# Patient Record
Sex: Female | Born: 1950 | Race: White | Hispanic: No | Marital: Married | Smoking: Former smoker
Health system: Southern US, Community
[De-identification: ages and names within clinical notes are randomized; demographics above are authoritative.]

## PROBLEM LIST (undated history)

## (undated) DIAGNOSIS — M19049 Primary osteoarthritis, unspecified hand: Secondary | ICD-10-CM

## (undated) DIAGNOSIS — E039 Hypothyroidism, unspecified: Secondary | ICD-10-CM

## (undated) DIAGNOSIS — K219 Gastro-esophageal reflux disease without esophagitis: Secondary | ICD-10-CM

## (undated) DIAGNOSIS — N951 Menopausal and female climacteric states: Secondary | ICD-10-CM

## (undated) DIAGNOSIS — Z8679 Personal history of other diseases of the circulatory system: Secondary | ICD-10-CM

## (undated) DIAGNOSIS — M8580 Other specified disorders of bone density and structure, unspecified site: Secondary | ICD-10-CM

## (undated) DIAGNOSIS — I10 Essential (primary) hypertension: Secondary | ICD-10-CM

## (undated) DIAGNOSIS — M545 Low back pain, unspecified: Secondary | ICD-10-CM

## (undated) DIAGNOSIS — N6019 Diffuse cystic mastopathy of unspecified breast: Secondary | ICD-10-CM

## (undated) DIAGNOSIS — E079 Disorder of thyroid, unspecified: Secondary | ICD-10-CM

## (undated) DIAGNOSIS — M810 Age-related osteoporosis without current pathological fracture: Secondary | ICD-10-CM

## (undated) HISTORY — DX: Gastro-esophageal reflux disease without esophagitis: K21.9

## (undated) HISTORY — DX: Personal history of other diseases of the circulatory system: Z86.79

## (undated) HISTORY — DX: Low back pain, unspecified: M54.50

## (undated) HISTORY — DX: Disorder of thyroid, unspecified: E07.9

## (undated) HISTORY — DX: Age-related osteoporosis without current pathological fracture: M81.0

## (undated) HISTORY — PX: DILATION AND CURETTAGE OF UTERUS: SHX78

## (undated) HISTORY — DX: Menopausal and female climacteric states: N95.1

## (undated) HISTORY — DX: Primary osteoarthritis, unspecified hand: M19.049

## (undated) HISTORY — DX: Diffuse cystic mastopathy of unspecified breast: N60.19

## (undated) HISTORY — DX: Hypothyroidism, unspecified: E03.9

## (undated) HISTORY — DX: Essential (primary) hypertension: I10

## (undated) HISTORY — DX: Other specified disorders of bone density and structure, unspecified site: M85.80

## (undated) HISTORY — DX: Low back pain: M54.5

## (undated) HISTORY — PX: BREAST EXCISIONAL BIOPSY: SUR124

---

## 1989-03-05 HISTORY — PX: VAGINAL HYSTERECTOMY: SUR661

## 2005-06-03 ENCOUNTER — Emergency Department: Payer: Self-pay | Admitting: Emergency Medicine

## 2005-06-10 ENCOUNTER — Emergency Department: Payer: Self-pay | Admitting: Emergency Medicine

## 2006-12-30 ENCOUNTER — Ambulatory Visit: Payer: Self-pay | Admitting: Family Medicine

## 2007-10-17 ENCOUNTER — Emergency Department: Payer: Self-pay | Admitting: Emergency Medicine

## 2010-03-09 ENCOUNTER — Ambulatory Visit: Payer: Self-pay | Admitting: Family Medicine

## 2010-03-20 ENCOUNTER — Ambulatory Visit: Payer: Self-pay | Admitting: Family Medicine

## 2010-08-08 ENCOUNTER — Ambulatory Visit: Payer: Self-pay | Admitting: Family Medicine

## 2010-09-20 ENCOUNTER — Ambulatory Visit: Payer: Self-pay | Admitting: Family Medicine

## 2011-04-26 ENCOUNTER — Ambulatory Visit: Payer: Self-pay | Admitting: Family Medicine

## 2012-04-28 ENCOUNTER — Ambulatory Visit: Payer: Self-pay | Admitting: Family Medicine

## 2012-06-05 ENCOUNTER — Emergency Department: Payer: Self-pay | Admitting: Emergency Medicine

## 2012-06-12 ENCOUNTER — Other Ambulatory Visit: Payer: Self-pay | Admitting: Emergency Medicine

## 2012-06-12 DIAGNOSIS — M545 Low back pain: Secondary | ICD-10-CM

## 2012-06-20 ENCOUNTER — Ambulatory Visit
Admission: RE | Admit: 2012-06-20 | Discharge: 2012-06-20 | Disposition: A | Discharge status: Home / Self Care | Payer: Managed Care, Other (non HMO) | Source: Ambulatory Visit | Attending: Emergency Medicine | Admitting: Emergency Medicine

## 2012-06-20 ENCOUNTER — Other Ambulatory Visit: Payer: Self-pay

## 2012-06-20 DIAGNOSIS — M545 Low back pain: Secondary | ICD-10-CM

## 2013-04-03 ENCOUNTER — Ambulatory Visit: Payer: Self-pay | Admitting: Podiatry

## 2013-04-06 ENCOUNTER — Ambulatory Visit (INDEPENDENT_AMBULATORY_CARE_PROVIDER_SITE_OTHER): Payer: Managed Care, Other (non HMO) | Admitting: Podiatry

## 2013-04-06 ENCOUNTER — Encounter: Payer: Self-pay | Admitting: Podiatry

## 2013-04-06 VITALS — BP 150/87 | HR 69 | Resp 16 | Ht 67.0 in | Wt 125.0 lb

## 2013-04-06 DIAGNOSIS — Q828 Other specified congenital malformations of skin: Secondary | ICD-10-CM

## 2013-04-06 NOTE — Progress Notes (Signed)
She presents today with a chief complaint of porokeratosis plantar aspect of the bilateral foot.  Objective: Vital signs are stable she is alert and oriented x3. Pulses are palpable bilateral. Solitary porokeratotic lesions plantar aspect of the bilateral foot more on the right foot than the left.  Assessment: Pain in limb secondary to porokeratotic lesions  Plan: Debridement of all reactive hyperkeratosis today followup with her as needed appear

## 2013-04-29 ENCOUNTER — Ambulatory Visit: Payer: Self-pay | Admitting: Family Medicine

## 2013-08-26 ENCOUNTER — Ambulatory Visit (INDEPENDENT_AMBULATORY_CARE_PROVIDER_SITE_OTHER): Payer: Managed Care, Other (non HMO) | Admitting: Podiatry

## 2013-08-26 VITALS — BP 104/64 | HR 78 | Resp 16

## 2013-08-26 DIAGNOSIS — Q828 Other specified congenital malformations of skin: Secondary | ICD-10-CM

## 2013-08-26 NOTE — Progress Notes (Signed)
She presents today with a chief complaint of painful lesions the plantar aspect of the bilateral foot.  Objective: Pulses are palpable bilateral. Just porokeratotic lesions plantar aspect of the forefoot between the first and second metatarsal heads bilaterally.  Assessment: Porokeratosis bilateral.  Plan: Debridement of reactive hyperkeratosis bilateral.

## 2013-10-26 DIAGNOSIS — E559 Vitamin D deficiency, unspecified: Secondary | ICD-10-CM | POA: Insufficient documentation

## 2013-11-23 ENCOUNTER — Ambulatory Visit (INDEPENDENT_AMBULATORY_CARE_PROVIDER_SITE_OTHER): Payer: Managed Care, Other (non HMO) | Admitting: Podiatry

## 2013-11-23 DIAGNOSIS — Q828 Other specified congenital malformations of skin: Secondary | ICD-10-CM

## 2013-11-23 NOTE — Progress Notes (Signed)
She presents today with a chief complaint of painful calluses bilateral.  Objective: Pulses are palpable bilateral. 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  Assessment: Porokeratosis bilateral.  Plan: Debridement all reactive hyperkeratosis followup with me in 3 months

## 2014-02-22 ENCOUNTER — Ambulatory Visit: Payer: Managed Care, Other (non HMO) | Admitting: Podiatry

## 2014-03-08 ENCOUNTER — Ambulatory Visit: Payer: Managed Care, Other (non HMO) | Admitting: Podiatry

## 2014-05-03 ENCOUNTER — Ambulatory Visit: Payer: Self-pay | Admitting: Family Medicine

## 2014-08-16 ENCOUNTER — Other Ambulatory Visit: Payer: Self-pay | Admitting: Unknown Physician Specialty

## 2014-08-24 DIAGNOSIS — M8580 Other specified disorders of bone density and structure, unspecified site: Secondary | ICD-10-CM | POA: Insufficient documentation

## 2014-08-24 DIAGNOSIS — E039 Hypothyroidism, unspecified: Secondary | ICD-10-CM | POA: Insufficient documentation

## 2014-08-24 DIAGNOSIS — N951 Menopausal and female climacteric states: Secondary | ICD-10-CM | POA: Insufficient documentation

## 2014-08-24 DIAGNOSIS — I10 Essential (primary) hypertension: Secondary | ICD-10-CM | POA: Insufficient documentation

## 2014-08-24 DIAGNOSIS — K219 Gastro-esophageal reflux disease without esophagitis: Secondary | ICD-10-CM | POA: Insufficient documentation

## 2014-08-24 DIAGNOSIS — M19049 Primary osteoarthritis, unspecified hand: Secondary | ICD-10-CM | POA: Insufficient documentation

## 2014-08-24 DIAGNOSIS — M545 Low back pain, unspecified: Secondary | ICD-10-CM | POA: Insufficient documentation

## 2014-08-30 ENCOUNTER — Ambulatory Visit (INDEPENDENT_AMBULATORY_CARE_PROVIDER_SITE_OTHER): Payer: Managed Care, Other (non HMO) | Admitting: Family Medicine

## 2014-08-30 ENCOUNTER — Encounter: Payer: Self-pay | Admitting: Family Medicine

## 2014-08-30 VITALS — BP 149/95 | HR 77 | Temp 98.2°F | Ht 66.5 in | Wt 134.0 lb

## 2014-08-30 DIAGNOSIS — Z0000 Encounter for general adult medical examination without abnormal findings: Secondary | ICD-10-CM | POA: Diagnosis not present

## 2014-08-30 NOTE — Patient Instructions (Signed)
If your feet become cooler or turn bluish or purple, let me know right away to test the circulation in your legs It is likely related to your thyroid I'll encourage monthly self-breast exams and yearly clinical breast exams, as well as yearly or every other year mammograms Try to get 3 servings of calcium a day, best in your food (spinach, kale, broccoli, almond milk, etc.) Return for a Welcome to Medicare visit if you are interested Return for a physical in one year if your insurance will still cover If insurance won't cover a "physical", then Medicare will cover a yearly wellness visit Your goal blood pressure is less than 150 mmHg on top Try to follow the DASH guidelines (DASH stands for Dietary Approaches to Stop Hypertension) Try to limit the sodium in your diet.  Ideally, consume less than 1.5 grams (less than 1,500mg ) per day. Do not add salt when cooking or at the table.  Check the sodium amount on labels when shopping, and choose items lower in sodium when given a choice. Avoid or limit foods that already contain a lot of sodium. Eat a diet rich in fruits and vegetables and whole grains.

## 2014-08-30 NOTE — Progress Notes (Signed)
BP 149/95 mmHg  Pulse 77  Temp(Src) 98.2 F (36.8 C)  Ht 5' 6.5" (1.689 m)  Wt 134 lb (60.782 kg)  BMI 21.31 kg/m2  SpO2 95%   Subjective:    Patient ID: Lauren Benson, female    DOB: [birth year], 64 y.o.   MRN: [id number]  HPI: Lauren Benson is a 64 y.o. female  Chief Complaint  Patient presents with  . Annual Exam   She is here for a physical; BP higher than usual, checks it away from doctor and gets 117 to 128 on top, in the 70s on the bottom; had some cheeze-its for lunch Another doctor manages thyroid issues  Past Medical History  Diagnosis Date  . Thyroid condition   . Osteoporosis   . Hypertension   . Hypothyroidism   . Osteoarthritis of hand   . GERD (gastroesophageal reflux disease)   . Symptomatic menopausal or female climacteric states   . Lumbago   . History of rheumatic fever   . Fibrocystic breast disease     Relevant past medical, surgical, family and social history reviewed and updated as indicated. Interim medical history since our last visit reviewed. Allergies and medications reviewed and updated. Current Outpatient Prescriptions on File Prior to Visit  Medication Sig Dispense Refill  . gabapentin (NEURONTIN) 400 MG capsule Take 400 mg by mouth at bedtime.    . meloxicam (MOBIC) 7.5 MG tablet Take 7.5 mg by mouth. One by mouth three or four days a week if needed for relief; take with food or milk.    Marland Kitchen PREMARIN 0.3 MG tablet Take 0.3 mg by mouth daily.     . ranitidine (ZANTAC) 300 MG tablet Take 1 tablet (300 mg total) by mouth daily. (Patient taking differently: Take 300 mg by mouth daily as needed. ) 90 tablet 3  . SYNTHROID 100 MCG tablet Take 100 mcg by mouth daily before breakfast.     . lisinopril (PRINIVIL,ZESTRIL) 10 MG tablet Take 10 mg by mouth daily.      No current facility-administered medications on file prior to visit.   Depression screen [facility] 2/9 08/30/2014  Decreased Interest 0  Down, Depressed, Hopeless 0  PHQ - 2  Score 0   Review of Systems  Constitutional: Negative for fever and unexpected weight change.  HENT: Negative for ear pain, mouth sores, nosebleeds and sore throat.   Eyes: Negative for pain.  Respiratory: Negative for cough and wheezing.   Cardiovascular: Negative for chest pain, palpitations and leg swelling.  Gastrointestinal: Negative for diarrhea, constipation and blood in stool.       Eats a lot of salads, affects bowels  Endocrine: Positive for cold intolerance (feet get cold, Dr. Renae Benson said symptoms of thyroid problem, comes and goes). Negative for polydipsia, polyphagia and polyuria.  Genitourinary: Negative for dysuria, hematuria and pelvic pain.       S/p hysterectomy and BSO for noncancerous  Musculoskeletal: Positive for arthralgias (using meloxicam for that, doing pretty well a few times a week).  Skin: Negative for color change, pallor, rash and wound.  Neurological: Negative for tremors.  Hematological: Negative for adenopathy. Does not bruise/bleed easily.  Psychiatric/Behavioral: Negative for dysphoric mood. The patient is not nervous/anxious.    Per HPI unless specifically indicated above Family History  Problem Relation Age of Onset  . Alzheimer's disease Mother   . Cancer Brother     mesothelioma  . Cancer Maternal Aunt     breast  maternal aunt had  breast cancer, maybe in her 7250s or 5460s; only one with breast cancer Brother was a Psychologist, occupationalwelder, occupational cancer Mother was in her mid-80s when diagnosed when she quit smokeless tobacco, behavioral Patient reports no memory issues     Objective:    BP 149/95 mmHg  Pulse 77  Temp(Src) 98.2 F (36.8 C)  Ht 5' 6.5" (1.689 m)  Wt 134 lb (60.782 kg)  BMI 21.31 kg/m2  SpO2 95%  Wt Readings from Last 3 Encounters:  08/30/14 134 lb (60.782 kg)  06/25/14 132 lb (59.875 kg)  04/06/13 125 lb (56.7 kg)    Physical Exam  Constitutional: She appears well-developed and well-nourished. No distress.  HENT:  Head:  Normocephalic and atraumatic.  Eyes: Conjunctivae and EOM are normal. Right eye exhibits no hordeolum. Left eye exhibits no hordeolum. No scleral icterus.  Neck: Carotid bruit is not present. No thyromegaly present.  Cardiovascular: Normal rate, regular rhythm, S1 normal, S2 normal and normal heart sounds.   No extrasystoles are present.  Pulmonary/Chest: Effort normal and breath sounds normal. No respiratory distress. Right breast exhibits no inverted nipple, no mass, no nipple discharge, no skin change and no tenderness. Left breast exhibits no inverted nipple, no mass, no nipple discharge, no skin change and no tenderness. Breasts are symmetrical.  Abdominal: Soft. Normal appearance and bowel sounds are normal. She exhibits no distension, no abdominal bruit, no pulsatile midline mass and no mass. There is no hepatosplenomegaly. There is no tenderness. No hernia.  Musculoskeletal: Normal range of motion. She exhibits no edema.  Lymphadenopathy:       Head (right side): No submandibular adenopathy present.       Head (left side): No submandibular adenopathy present.    She has no cervical adenopathy.    She has no axillary adenopathy.  Neurological: She is alert. She displays no tremor. No cranial nerve deficit. She exhibits normal muscle tone. Gait normal.  Skin: Skin is warm and dry. No bruising and no ecchymosis noted. She is not diaphoretic. No cyanosis. No pallor.  Psychiatric: Her speech is normal and behavior is normal. Thought content normal. Her mood appears not anxious. She does not exhibit a depressed mood.   No results found for this or any previous visit.    Assessment & Plan:   Problem List Items Addressed This Visit      Other   Preventative health care - Primary    Primary reason for today's visit was preventive care; age-appropriate health maintenance discussed/ordered and healthy living encouraged        Elevated BP today; she'll monitor  Follow up plan: Return in  about 1 year (around 08/30/2015) for physical. An After Visit Summary was printed and given to the patient.

## 2014-08-31 ENCOUNTER — Telehealth: Payer: Self-pay | Admitting: Family Medicine

## 2014-08-31 DIAGNOSIS — Z5181 Encounter for therapeutic drug level monitoring: Secondary | ICD-10-CM

## 2014-08-31 NOTE — Telephone Encounter (Signed)
Left detailed message on patient's identified voicemail.

## 2014-08-31 NOTE — Assessment & Plan Note (Signed)
Primary reason for today's visit was preventive care; age-appropriate health maintenance discussed/ordered and healthy living encouraged

## 2014-08-31 NOTE — Telephone Encounter (Signed)
Routing to provider  

## 2014-08-31 NOTE — Telephone Encounter (Signed)
Good question No, I asked her if we monitored her thyroid and she said another doctor did that We just checked her kidney function April 22nd, so that's not due again until October 22nd or just after (just a BMP) Her last lipid panel was amazing so she doesn't need that checked again until 2018

## 2014-08-31 NOTE — Telephone Encounter (Signed)
Pt wondering if she should have done labs yesterday.  Please call her.

## 2014-09-01 ENCOUNTER — Telehealth: Payer: Self-pay | Admitting: Family Medicine

## 2014-09-01 NOTE — Telephone Encounter (Signed)
Was in Monday for cpe and bp was high and has been high since and she was wondering if she needed to go back on bp medication. Pt would like a call back at the number provided

## 2014-09-01 NOTE — Telephone Encounter (Signed)
Routing to provider  

## 2014-09-01 NOTE — Telephone Encounter (Signed)
The list we have shows that she is on lisinopril; please clarify if she is taking that; she was just here so I hope that is correct

## 2014-09-02 MED ORDER — LISINOPRIL 10 MG PO TABS: 5.0000 mg | ORAL_TABLET | Freq: Every day | ORAL | Status: DC

## 2014-09-02 NOTE — Telephone Encounter (Signed)
She said you had told her visit before last to stop the Lisinopril. Her BP today is much better, 126/78. She wants to know if she should restart the Lisinopril or decrease it or maybe take it every other day?

## 2014-09-02 NOTE — Telephone Encounter (Signed)
Let's have her take half of a pill daily; I sent a new Rx in for her Monitor BP and keep an eye on it Goal top number less than 150, but obviously between 120-130 would be better Let us know about any issues

## 2014-09-03 NOTE — Telephone Encounter (Signed)
Left message for patient to call.

## 2014-09-07 NOTE — Telephone Encounter (Signed)
Patient notified

## 2014-09-17 ENCOUNTER — Telehealth: Payer: Self-pay

## 2014-09-17 NOTE — Telephone Encounter (Signed)
Patient notified

## 2014-09-17 NOTE — Telephone Encounter (Signed)
She went back on her BP med as you had requested, but the last few days it's been pretty low. 96-98 over mid to high 60s. She wants to know if it should be adjusted.

## 2014-09-17 NOTE — Telephone Encounter (Signed)
Stop the blood pressure medicine completely Give her body a few weeks to get back to her normal set point Monitor occasionally and let us know what her readings are Avoid salt Do stay well-hydrated

## 2014-10-18 ENCOUNTER — Ambulatory Visit (INDEPENDENT_AMBULATORY_CARE_PROVIDER_SITE_OTHER): Payer: Managed Care, Other (non HMO) | Admitting: Podiatry

## 2014-10-18 DIAGNOSIS — Q828 Other specified congenital malformations of skin: Secondary | ICD-10-CM | POA: Diagnosis not present

## 2014-10-18 NOTE — Progress Notes (Signed)
She presents today with a chief complaint of calluses beneath the second metatarsophalangeal joints bilateral. She recalls that I had told her to stop wearing loosefitting shoes slides bare feet and flip flops. She states that that is so hard for her to do and she has yet to do so. She denies any changes in her past medical history medications allergies surgery social history or feet.  Objective: Vital signs are stable she is alert and oriented 3 pulses are strongly palpable bilateral. Neurologic sensorium is intact bilateral. Deep tendon reflexes are intact bilateral and muscle strength is 5 over 5 dorsiflexion plantar flexors and inverters everters on physical musculature is intact. Orthopedic evaluation demonstrates all joints distal to the ankle full range of motion without crepitation. Narrow long foot with mild hammertoe deformities are noted. Cutaneous evaluation demonstrates poor keratotic lesion sub-echo and metatarsophalangeal joints bilateral. These are multiple in nature but within 1 area.  Assessment: 64 year old white female with a history of porokeratosis subsecond metatarsophalangeal joint without complications.  Plan: Debridement of all reactive hyperkeratosis today discussed appropriate shoe gear stretching exercises and ice therapy.

## 2015-01-08 ENCOUNTER — Other Ambulatory Visit: Payer: Self-pay | Admitting: Family Medicine

## 2015-01-09 ENCOUNTER — Other Ambulatory Visit: Payer: Self-pay | Admitting: Family Medicine

## 2015-02-08 ENCOUNTER — Other Ambulatory Visit: Payer: Self-pay | Admitting: Family Medicine

## 2015-02-08 NOTE — Telephone Encounter (Signed)
Routing to provider  

## 2015-02-09 ENCOUNTER — Other Ambulatory Visit: Payer: Self-pay

## 2015-02-09 NOTE — Telephone Encounter (Signed)
Routing to provider. She wants to know if she can get refills.

## 2015-02-09 NOTE — Telephone Encounter (Signed)
Patient calling to check the status of her refill request for Premarain 0.3mg .  Please call to advise.

## 2015-02-10 ENCOUNTER — Other Ambulatory Visit: Payer: Self-pay | Admitting: Family Medicine

## 2015-02-10 NOTE — Telephone Encounter (Signed)
rx

## 2015-02-10 NOTE — Telephone Encounter (Signed)
mammo UTD per [facility] list; rx approved

## 2015-02-10 NOTE — Telephone Encounter (Signed)
Already done Please remind her of 48 hour turn around; thanks; it looks like she called several hours after this was routed to me

## 2015-02-14 ENCOUNTER — Other Ambulatory Visit: Payer: Self-pay | Admitting: Family Medicine

## 2015-02-14 NOTE — Telephone Encounter (Signed)
Left message to call.

## 2015-02-14 NOTE — Telephone Encounter (Signed)
We were hoping to recheck patient's kidney function around October 22nd; I'm sure it completely slipped her mind Please call her and ask if she can come by for a NON-fasting BMP in the next week I'll send refill of medicine in the meantime, but we want to stay on top of how her kidneys are functioning on this medicine Thank you

## 2015-02-14 NOTE — Telephone Encounter (Signed)
Your patient.  Thanks 

## 2015-02-14 NOTE — Telephone Encounter (Signed)
Patient notified

## 2015-02-18 ENCOUNTER — Other Ambulatory Visit: Payer: Managed Care, Other (non HMO)

## 2015-02-18 DIAGNOSIS — I10 Essential (primary) hypertension: Secondary | ICD-10-CM

## 2015-02-19 ENCOUNTER — Encounter: Payer: Self-pay | Admitting: Family Medicine

## 2015-02-19 LAB — BASIC METABOLIC PANEL
BUN / CREAT RATIO: 11 (ref 11–26)
BUN: 11 mg/dL (ref 8–27)
CALCIUM: 9.1 mg/dL (ref 8.7–10.3)
CO2: 27 mmol/L (ref 18–29)
Chloride: 102 mmol/L (ref 96–106)
Creatinine, Ser: 0.97 mg/dL (ref 0.57–1.00)
GFR, EST AFRICAN AMERICAN: 71 mL/min/{1.73_m2} (ref >59)
GFR, EST NON AFRICAN AMERICAN: 61 mL/min/{1.73_m2} (ref >59)
Glucose: 85 mg/dL (ref 65–99)
POTASSIUM: 4.8 mmol/L (ref 3.5–5.2)
Sodium: 142 mmol/L (ref 134–144)

## 2015-02-19 NOTE — Progress Notes (Signed)
I entered the BMP order back in June, 2016 Not sure why new order had to be entered, but Multimedia programmer'll approve Office manager notified and she'll into why my order did not appear for staff

## 2015-03-09 ENCOUNTER — Ambulatory Visit: Payer: Managed Care, Other (non HMO) | Admitting: Podiatry

## 2015-03-21 ENCOUNTER — Other Ambulatory Visit: Payer: Self-pay | Admitting: Family Medicine

## 2015-03-21 NOTE — Telephone Encounter (Signed)
Dec 2016 BMP reveiwed; Rx approved

## 2015-03-23 ENCOUNTER — Ambulatory Visit (INDEPENDENT_AMBULATORY_CARE_PROVIDER_SITE_OTHER): Payer: Managed Care, Other (non HMO) | Admitting: Podiatry

## 2015-03-23 ENCOUNTER — Encounter: Payer: Self-pay | Admitting: Podiatry

## 2015-03-23 DIAGNOSIS — Q828 Other specified congenital malformations of skin: Secondary | ICD-10-CM

## 2015-03-23 NOTE — Progress Notes (Signed)
She presents today for follow-up of her porokeratosis plantar aspect of the bilateral foot.  Objective: Vital signs are stable she is alert and oriented 3 pulses are palpable bilateral. Multiple porokeratosis plantar aspect of the bilateral foot. We discussed utilizing salicylic acid which she does not want to keep this dry for 3 days. We also discussed the use of possible aperture pads or orthotics.  Assessment: Porokeratosis plantar aspect of the bilateral foot right foot is worse beneath the second metatarsophalangeal joint right.  Plan: Debrided all reactive hyperkeratoses for her today and discussed alternatives. Discussed the possible need for orthotics.

## 2015-04-08 ENCOUNTER — Telehealth: Payer: Self-pay | Admitting: Family Medicine

## 2015-04-08 NOTE — Telephone Encounter (Signed)
Pt would like a call back with some suggestions on what she should take for a sinus infection.

## 2015-04-11 NOTE — Telephone Encounter (Signed)
Routing to provider. Message was left Friday afternoon when we were not in the office.

## 2015-04-11 NOTE — Telephone Encounter (Signed)
I apologized to patient for her not getting a callback. She stated that it was ok, it only lasted over the weekend and she is feeling much better. I advised her if she needed anything else to call us back.

## 2015-04-11 NOTE — Telephone Encounter (Signed)
Please apologize to patient that no one checked your phone messages; ask Laurelyn Sickle if that can be addressed in the future so our patients are covered She can use nasal saline spray or neti pot to flush out nasal passages She can use a nasal corticosteroid like Rhinocort to help with congestion; do NOT use Afrin Tylenol for minor discomfort appt if sx last more than 10 days or she develops fever

## 2015-05-09 ENCOUNTER — Other Ambulatory Visit: Payer: Self-pay | Admitting: Family Medicine

## 2015-05-09 NOTE — Telephone Encounter (Signed)
Last mammo within last 2 years

## 2015-06-10 ENCOUNTER — Encounter: Payer: Self-pay | Admitting: Family Medicine

## 2015-06-10 ENCOUNTER — Ambulatory Visit (INDEPENDENT_AMBULATORY_CARE_PROVIDER_SITE_OTHER): Payer: Managed Care, Other (non HMO) | Admitting: Family Medicine

## 2015-06-10 VITALS — BP 124/62 | HR 89 | Temp 98.0°F | Resp 14 | Wt 141.0 lb

## 2015-06-10 DIAGNOSIS — I10 Essential (primary) hypertension: Secondary | ICD-10-CM

## 2015-06-10 DIAGNOSIS — L309 Dermatitis, unspecified: Secondary | ICD-10-CM

## 2015-06-10 DIAGNOSIS — N182 Chronic kidney disease, stage 2 (mild): Secondary | ICD-10-CM | POA: Diagnosis not present

## 2015-06-10 DIAGNOSIS — E038 Other specified hypothyroidism: Secondary | ICD-10-CM

## 2015-06-10 DIAGNOSIS — M810 Age-related osteoporosis without current pathological fracture: Secondary | ICD-10-CM | POA: Diagnosis not present

## 2015-06-10 MED ORDER — TRIAMCINOLONE ACETONIDE 0.1 % EX CREA: 1.0000 "application " | TOPICAL_CREAM | Freq: Two times a day (BID) | CUTANEOUS | Status: DC

## 2015-06-10 NOTE — Assessment & Plan Note (Addendum)
Fall precautions; three servings of calcium daily, 1000 vit D3 daily recommended; NOT more than that unless specifically instructed by MD; explained risk of too much vitamin D, as it is a fat soluble vitamin and can build up and become toxic

## 2015-06-10 NOTE — Patient Instructions (Addendum)
Your goal blood pressure is less than 150 mmHg on top. Try to follow the DASH guidelines (DASH stands for Dietary Approaches to Stop Hypertension) Try to limit the sodium in your diet.  Ideally, consume less than 1.5 grams (less than 1,500mg ) per day. Do not add salt when cooking or at the table.  Check the sodium amount on labels when shopping, and choose items lower in sodium when given a choice. Avoid or limit foods that already contain a lot of sodium. Eat a diet rich in fruits and vegetables and whole grains. Consider getting a PCV-13 vaccine at age 43+, then a PPSV-23 vaccine at age 61+ Consider getting a shingles vaccine (live virus one), but that would not be given together within one month of the pneumonia vaccine(s) Fall precautions; three servings of calcium daily, 5,000 vit D3 twice a week We'll check labs today Return in 6 months   DASH Eating Plan DASH stands for "Dietary Approaches to Stop Hypertension." The DASH eating plan is a healthy eating plan that has been shown to reduce high blood pressure (hypertension). Additional health benefits may include reducing the risk of type 2 diabetes mellitus, heart disease, and stroke. The DASH eating plan may also help with weight loss. WHAT DO I NEED TO KNOW ABOUT THE DASH EATING PLAN? For the DASH eating plan, you will follow these general guidelines:  Choose foods with a percent daily value for sodium of less than 5% (as listed on the food label).  Use salt-free seasonings or herbs instead of table salt or sea salt.  Check with your health care provider or pharmacist before using salt substitutes.  Eat lower-sodium products, often labeled as "lower sodium" or "no salt added."  Eat fresh foods.  Eat more vegetables, fruits, and low-fat dairy products.  Choose whole grains. Look for the word "whole" as the first word in the ingredient list.  Choose fish and skinless chicken or [place] more often than red meat. Limit fish, poultry,  and meat to 6 oz (170 g) each day.  Limit sweets, desserts, sugars, and sugary drinks.  Choose heart-healthy fats.  Limit cheese to 1 oz (28 g) per day.  Eat more home-cooked food and less restaurant, buffet, and fast food.  Limit fried foods.  Cook foods using methods other than frying.  Limit canned vegetables. If you do use them, rinse them well to decrease the sodium.  When eating at a restaurant, ask that your food be prepared with less salt, or no salt if possible. WHAT FOODS CAN I EAT? Seek help from a dietitian for individual calorie needs. Grains Whole grain or whole wheat bread. Brown rice. Whole grain or whole wheat pasta. Quinoa, bulgur, and whole grain cereals. Low-sodium cereals. Corn or whole wheat flour tortillas. Whole grain cornbread. Whole grain crackers. Low-sodium crackers. Vegetables Fresh or frozen vegetables (raw, steamed, roasted, or grilled). Low-sodium or reduced-sodium tomato and vegetable juices. Low-sodium or reduced-sodium tomato sauce and paste. Low-sodium or reduced-sodium canned vegetables.  Fruits All fresh, canned (in natural juice), or frozen fruits. Meat and Other Protein Products Ground beef (85% or leaner), grass-fed beef, or beef trimmed of fat. Skinless chicken or [place]. Ground chicken or [place]. Pork trimmed of fat. All fish and seafood. Eggs. Dried beans, peas, or lentils. Unsalted nuts and seeds. Unsalted canned beans. Dairy Low-fat dairy products, such as skim or 1% milk, 2% or reduced-fat cheeses, low-fat ricotta or cottage cheese, or plain low-fat yogurt. Low-sodium or reduced-sodium cheeses. Fats and Oils Tub  margarines without trans fats. Light or reduced-fat mayonnaise and salad dressings (reduced sodium). Avocado. Safflower, olive, or canola oils. Natural peanut or almond butter. Other Unsalted popcorn and pretzels. The items listed above may not be a complete list of recommended foods or beverages. Contact your dietitian for more  options. WHAT FOODS ARE NOT RECOMMENDED? Grains White bread. White pasta. White rice. Refined cornbread. Bagels and croissants. Crackers that contain trans fat. Vegetables Creamed or fried vegetables. Vegetables in a cheese sauce. Regular canned vegetables. Regular canned tomato sauce and paste. Regular tomato and vegetable juices. Fruits Dried fruits. Canned fruit in light or heavy syrup. Fruit juice. Meat and Other Protein Products Fatty cuts of meat. Ribs, chicken wings, bacon, sausage, bologna, salami, chitterlings, fatback, hot dogs, bratwurst, and packaged luncheon meats. Salted nuts and seeds. Canned beans with salt. Dairy Whole or 2% milk, cream, half-and-half, and cream cheese. Whole-fat or sweetened yogurt. Full-fat cheeses or blue cheese. Nondairy creamers and whipped toppings. Processed cheese, cheese spreads, or cheese curds. Condiments Onion and garlic salt, seasoned salt, table salt, and sea salt. Canned and packaged gravies. Worcestershire sauce. Tartar sauce. Barbecue sauce. Teriyaki sauce. Soy sauce, including reduced sodium. Steak sauce. Fish sauce. Oyster sauce. Cocktail sauce. Horseradish. Ketchup and mustard. Meat flavorings and tenderizers. Bouillon cubes. Hot sauce. Tabasco sauce. Marinades. Taco seasonings. Relishes. Fats and Oils Butter, stick margarine, lard, shortening, ghee, and bacon fat. Coconut, palm kernel, or palm oils. Regular salad dressings. Other Pickles and olives. Salted popcorn and pretzels. The items listed above may not be a complete list of foods and beverages to avoid. Contact your dietitian for more information. WHERE CAN I FIND MORE INFORMATION? National Heart, Lung, and Blood Institute: CablePromo.it   This information is not intended to replace advice given to you by your health care provider. Make sure you discuss any questions you have with your health care provider.   Document Released: 02/08/2011  Document Revised: 03/12/2014 Document Reviewed: 12/24/2012 Elsevier Interactive Patient Education 2016 Elsevier Inc.   Pneumococcal Conjugate Vaccine (PCV13)  1. Why get vaccinated? Vaccination can protect both children and adults from pneumococcal disease. Pneumococcal disease is caused by bacteria that can spread from person to person through close contact. It can cause ear infections, and it can also lead to more serious infections of the:  Lungs (pneumonia),  Blood (bacteremia), and  Covering of the brain and spinal cord (meningitis). Pneumococcal pneumonia is most common among adults. Pneumococcal meningitis can cause deafness and brain damage, and it kills about 1 child in 10 who get it. Anyone can get pneumococcal disease, but children under 82 years of age and adults 35 years and older, people with certain medical conditions, and cigarette smokers are at the highest risk. Before there was a vaccine, the [place] saw:  more than 700 cases of meningitis,  about 13,000 blood infections,  about 5 million ear infections, and  about 200 deaths in children under 5 each year from pneumococcal disease. Since vaccine became available, severe pneumococcal disease in these children has fallen by 88%. About 18,000 older adults die of pneumococcal disease each year in the [place]. Treatment of pneumococcal infections with penicillin and other drugs is not as effective as it used to be, because some strains of the disease have become resistant to these drugs. This makes prevention of the disease, through vaccination, even more important. 2. PCV13 vaccine Pneumococcal conjugate vaccine (called PCV13) protects against 13 types of pneumococcal bacteria. PCV13 is routinely given to children at  2, 4, 6, and 92-81 months of age. It is also recommended for children and adults 56 to 74 years of age with certain health conditions, and for all adults 59 years of age and older. Your doctor can  give you details. 3. Some people should not get this vaccine Anyone who has ever had a life-threatening allergic reaction to a dose of this vaccine, to an earlier pneumococcal vaccine called PCV7, or to any vaccine containing diphtheria toxoid (for example, DTaP), should not get PCV13. Anyone with a severe allergy to any component of PCV13 should not get the vaccine. Tell your doctor if the person being vaccinated has any severe allergies. If the person scheduled for vaccination is not feeling well, your healthcare provider might decide to reschedule the shot on another day. 4. Risks of a vaccine reaction With any medicine, including vaccines, there is a chance of reactions. These are usually mild and go away on their own, but serious reactions are also possible. Problems reported following PCV13 varied by age and dose in the series. The most common problems reported among children were:  About half became drowsy after the shot, had a temporary loss of appetite, or had redness or tenderness where the shot was given.  About 1 out of 3 had swelling where the shot was given.  About 1 out of 3 had a mild fever, and about 1 in 20 had a fever over 102.30F.  Up to about 8 out of 10 became fussy or irritable. Adults have reported pain, redness, and swelling where the shot was given; also mild fever, fatigue, headache, chills, or muscle pain. Young children who get PCV13 along with inactivated flu vaccine at the same time may be at increased risk for seizures caused by fever. Ask your doctor for more information. Problems that could happen after any vaccine:  People sometimes faint after a medical procedure, including vaccination. Sitting or lying down for about 15 minutes can help prevent fainting, and injuries caused by a fall. Tell your doctor if you feel dizzy, or have vision changes or ringing in the ears.  Some older children and adults get severe pain in the shoulder and have difficulty moving  the arm where a shot was given. This happens very rarely.  Any medication can cause a severe allergic reaction. Such reactions from a vaccine are very rare, estimated at about 1 in a million doses, and would happen within a few minutes to a few hours after the vaccination. As with any medicine, there is a very small chance of a vaccine causing a serious injury or death. The safety of vaccines is always being monitored. For more information, visit: http://floyd.org/ 5. What if there is a serious reaction? What should I look for?  Look for anything that concerns you, such as signs of a severe allergic reaction, very high fever, or unusual behavior. Signs of a severe allergic reaction can include hives, swelling of the face and throat, difficulty breathing, a fast heartbeat, dizziness, and weakness-usually within a few minutes to a few hours after the vaccination. What should I do?  If you think it is a severe allergic reaction or other emergency that can't wait, call 9-1-1 or get the person to the nearest hospital. Otherwise, call your doctor. Reactions should be reported to the Vaccine Adverse Event Reporting System (VAERS). Your doctor should file this report, or you can do it yourself through the VAERS web site at www.vaers.LAgents.no, or by calling 1-(907) 540-8270. VAERS does not give  medical advice. 6. The National Vaccine Injury Compensation Program The [employer] Vaccine Injury Compensation Program (VICP) is a federal program that was created to compensate people who may have been injured by certain vaccines. Persons who believe they may have been injured by a vaccine can learn about the program and about filing a claim by calling 1-(432)679-8962 or visiting the VICP website at SpiritualWord.atwww.hrsa.gov/vaccinecompensation. There is a time limit to file a claim for compensation. 7. How can I learn more?  Ask your healthcare provider. He or she can give you the vaccine package insert or suggest other  sources of information.  Call your local or state health department.  Contact the Centers for Disease Control and Prevention (CDC):  Call 407-774-83871-(613)773-1998 (1-800-CDC-INFO) or  Visit CDC's website at PicCapture.uywww.cdc.gov/vaccines Vaccine Information Statement PCV13 Vaccine (01/07/2014)   This information is not intended to replace advice given to you by your health care provider. Make sure you discuss any questions you have with your health care provider.   Document Released: 12/17/2005 Document Revised: 03/12/2014 Document Reviewed: 01/14/2014 Elsevier Interactive Patient Education 2016 Elsevier Inc. Pneumococcal Polysaccharide Vaccine: What You Need to Know 1. Why get vaccinated? Vaccination can protect older adults (and some children and younger adults) from pneumococcal disease. Pneumococcal disease is caused by bacteria that can spread from person to person through close contact. It can cause ear infections, and it can also lead to more serious infections of the:   Lungs (pneumonia),  Blood (bacteremia), and  Covering of the brain and spinal cord (meningitis). Meningitis can cause deafness and brain damage, and it can be fatal. Anyone can get pneumococcal disease, but children under 282 years of age, people with certain medical conditions, adults over 66 years of age, and cigarette smokers are at the highest risk. About 18,000 older adults die each year from pneumococcal disease in the [place]. Treatment of pneumococcal infections with penicillin and other drugs used to be more effective. But some strains of the disease have become resistant to these drugs. This makes prevention of the disease, through vaccination, even more important. 2. Pneumococcal polysaccharide vaccine (PPSV23) Pneumococcal polysaccharide vaccine (PPSV23) protects against 23 types of pneumococcal bacteria. It will not prevent all pneumococcal disease. PPSV23 is recommended for:  All adults 66 years of age and  older,  Anyone 2 through 66 years of age with certain long-term health problems,  Anyone 2 through 66 years of age with a weakened immune system,  Adults 10819 through 66 years of age who smoke cigarettes or have asthma. Most people need only one dose of PPSV. A second dose is recommended for certain high-risk groups. People 6065 and older should get a dose even if they have gotten one or more doses of the vaccine before they turned 65. Your healthcare provider can give you more information about these recommendations. Most healthy adults develop protection within 2 to 3 weeks of getting the shot. 3. Some people should not get this vaccine  Anyone who has had a life-threatening allergic reaction to PPSV should not get another dose.  Anyone who has a severe allergy to any component of PPSV should not receive it. Tell your provider if you have any severe allergies.  Anyone who is moderately or severely ill when the shot is scheduled may be asked to wait until they recover before getting the vaccine. Someone with a mild illness can usually be vaccinated.  Children less than 562 years of age should not receive this vaccine.  There is no  evidence that PPSV is harmful to either a pregnant woman or to her fetus. However, as a precaution, women who need the vaccine should be vaccinated before becoming pregnant, if possible. 4. Risks of a vaccine reaction With any medicine, including vaccines, there is a chance of side effects. These are usually mild and go away on their own, but serious reactions are also possible. About half of people who get PPSV have mild side effects, such as redness or pain where the shot is given, which go away within about two days. Less than 1 out of 100 people develop a fever, muscle aches, or more severe local reactions. Problems that could happen after any vaccine:  People sometimes faint after a medical procedure, including vaccination. Sitting or lying down for about 15  minutes can help prevent fainting, and injuries caused by a fall. Tell your doctor if you feel dizzy, or have vision changes or ringing in the ears.  Some people get severe pain in the shoulder and have difficulty moving the arm where a shot was given. This happens very rarely.  Any medication can cause a severe allergic reaction. Such reactions from a vaccine are very rare, estimated at about 1 in a million doses, and would happen within a few minutes to a few hours after the vaccination. As with any medicine, there is a very remote chance of a vaccine causing a serious injury or death. The safety of vaccines is always being monitored. For more information, visit: http://floyd.org/ 5. What if there is a serious reaction? What should I look for? Look for anything that concerns you, such as signs of a severe allergic reaction, very high fever, or unusual behavior.  Signs of a severe allergic reaction can include hives, swelling of the face and throat, difficulty breathing, a fast heartbeat, dizziness, and weakness. These would usually start a few minutes to a few hours after the vaccination. What should I do? If you think it is a severe allergic reaction or other emergency that can't wait, call 9-1-1 or get to the nearest hospital. Otherwise, call your doctor. Afterward, the reaction should be reported to the Vaccine Adverse Event Reporting System (VAERS). Your doctor might file this report, or you can do it yourself through the VAERS web site at www.vaers.LAgents.no, or by calling 1-7012052965.  VAERS does not give medical advice. 6. How can I learn more?  Ask your doctor. He or she can give you the vaccine package insert or suggest other sources of information.  Call your local or state health department.  Contact the Centers for Disease Control and Prevention (CDC):  Call 330-389-9959 (1-800-CDC-INFO) or  Visit CDC's website at PicCapture.uy CDC Pneumococcal  Polysaccharide Vaccine VIS (06/26/13)   This information is not intended to replace advice given to you by your health care provider. Make sure you discuss any questions you have with your health care provider.   Document Released: 12/17/2005 Document Revised: 03/12/2014 Document Reviewed: 06/29/2013 Elsevier Interactive Patient Education 2016 Elsevier Inc. Shingles Vaccine: What You Need to Know WHAT IS SHINGLES?  Shingles is a painful skin rash, often with blisters. It is also called Herpes Zoster or just Zoster.  A shingles rash usually appears on one side of the face or body and lasts from 2 to 4 weeks. Its main symptom is pain, which can be quite severe. Other symptoms of shingles can include fever, headache, chills, and upset stomach. Very rarely, a shingles infection can lead to pneumonia, hearing problems, blindness, brain inflammation (  encephalitis), or death.  For about 1 person in 5, severe pain can continue even after the rash clears up. This is called post-herpetic neuralgia.  Shingles is caused by the Varicella Zoster virus. This is the same virus that causes chickenpox. Only someone who has had a case of chickenpox or rarely, has gotten chickenpox vaccine, can get shingles. The virus stays in your body. It can reappear many years later to cause a case of shingles.  You cannot catch shingles from another person with shingles. However, a person who has never had chickenpox (or chickenpox vaccine) could get chickenpox from someone with shingles. This is not very common.  Shingles is far more common in people 26 and older than in younger people. It is also more common in people whose immune systems are weakened because of a disease such as cancer or drugs such as steroids or chemotherapy.  At least 1 million people get shingles per year in the [place]. SHINGLES VACCINE  A vaccine for shingles was licensed in 2006. In clinical trials, the vaccine reduced the risk of shingles  by 50%. It can also reduce the pain in people who still get shingles after being vaccinated.  A single dose of shingles vaccine is recommended for adults 46 years of age and older. SOME PEOPLE SHOULD NOT GET SHINGLES VACCINE OR SHOULD WAIT A person should not get shingles vaccine if he or she:  Has ever had a life-threatening allergic reaction to gelatin, the antibiotic neomycin, or any other component of shingles vaccine. Tell your caregiver if you have any severe allergies.  Has a weakened immune system because of current:  AIDS or another disease that affects the immune system.  Treatment with drugs that affect the immune system, such as prolonged use of high-dose steroids.  Cancer treatment, such as radiation or chemotherapy.  Cancer affecting the bone marrow or lymphatic system, such as leukemia or lymphoma.  Is pregnant, or might be pregnant. Women should not become pregnant until at least 4 weeks after getting shingles vaccine. Someone with a minor illness, such as a cold, may be vaccinated. Anyone with a moderate or severe acute illness should usually wait until he or she recovers before getting the vaccine. This includes anyone with a temperature of 101.3 F (38 C) or higher. WHAT ARE THE RISKS FROM SHINGLES VACCINE?  A vaccine, like any medicine, could possibly cause serious problems, such as severe allergic reactions. However, the risk of a vaccine causing serious harm, or death, is extremely small.  No serious problems have been identified with shingles vaccine. Mild Problems  Redness, soreness, swelling, or itching at the site of the injection (about 1 person in 3).  Headache (about 1 person in 70). Like all vaccines, shingles vaccine is being closely monitored for unusual or severe problems. WHAT IF THERE IS A MODERATE OR SEVERE REACTION? What should I look for? Any unusual condition, such as a severe allergic reaction or a high fever. If a severe allergic reaction  occurred, it would be within a few minutes to an hour after the shot. Signs of a serious allergic reaction can include difficulty breathing, weakness, hoarseness or wheezing, a fast heartbeat, hives, dizziness, paleness, or swelling of the throat. What should I do?  Call your caregiver, or get the person to a caregiver right away.  Tell the caregiver what happened, the date and time it happened, and when the vaccination was given.  Ask the caregiver to report the reaction by filing a  Vaccine Adverse Event Reporting System (VAERS) form. Or, you can file this report through the VAERS web site at www.vaers.LAgents.no or by calling 1-250 687 6244. VAERS does not provide medical advice. HOW CAN I LEARN MORE?  Ask your caregiver. He or she can give you the vaccine package insert or suggest other sources of information.  Contact the Centers for Disease Control and Prevention (CDC):  Call 249 343 4116 (1-800-CDC-INFO).  Visit the CDC website at PicCapture.uy CDC Shingles Vaccine VIS (12/09/07)   This information is not intended to replace advice given to you by your health care provider. Make sure you discuss any questions you have with your health care provider.   Document Released: 12/17/2005 Document Revised: 07/06/2014 Document Reviewed: 06/11/2012 Elsevier Interactive Patient Education [employer]

## 2015-06-10 NOTE — Assessment & Plan Note (Signed)
Excellent control today; DASH guidelines

## 2015-06-10 NOTE — Assessment & Plan Note (Signed)
Limit NSAIDs; check BMP today; stay hydrated

## 2015-06-10 NOTE — Progress Notes (Signed)
BP 124/62 mmHg  Pulse 89  Temp(Src) 98 F (36.7 C) (Oral)  Resp 14  Wt 141 lb (63.957 kg)  SpO2 95%   Subjective:    Patient ID: Lauren Benson, female    DOB: 11-Dec-1950, 65 y.o.   MRN: [id number]  HPI: Lauren Benson is a 65 y.o. female  Chief Complaint  Patient presents with  . Medication Refill  . Hypothyroidism    see's endocrinology  . Osteoarthritis   Patient is known to me from my previous practice  For her OA, she takes meloxicam a few days a week; no big changes; urinating fine; no problems with heartburn, reflux or abdominal pain  GERD; very rarely has to take ranitidine; few and far between  [facility] endocrinologist, Dr. Aliene Altes, for her thyroid; she checked her thyroid lab February 15, 2015 TSH 1.082  CKD stage 2; discussed  Saw Dr. Adolphus Birchwood, dermatologist; feels like skin is burning at night; little prickly things; thought about bed bugs; threw all the stuff off of her bed off; new mattress cover; has mattress in plastic zipper thing; she called Dr. Adolphus Birchwood for cream in tub (triamcinolone); using just a little bit  She has been taking vitamin D supplementation, more than recommended; 5,000 iu daily  Relevant past medical, surgical, family and social history reviewed and updated as indicated. Interim medical history since our last visit reviewed. Allergies and medications reviewed and updated.  Review of Systems  Per HPI unless specifically indicated above     Objective:    BP 124/62 mmHg  Pulse 89  Temp(Src) 98 F (36.7 C) (Oral)  Resp 14  Wt 141 lb (63.957 kg)  SpO2 95%  Wt Readings from Last 3 Encounters:  06/10/15 141 lb (63.957 kg)  08/30/14 134 lb (60.782 kg)  06/25/14 132 lb (59.875 kg)    Physical Exam  Constitutional: She appears well-developed and well-nourished. No distress.  HENT:  Head: Normocephalic and atraumatic.  Eyes: EOM are normal. No scleral icterus.  Neck: No thyromegaly present.  Cardiovascular: Normal rate,  regular rhythm and normal heart sounds.   No murmur heard. Pulmonary/Chest: Effort normal and breath sounds normal. No respiratory distress. She has no wheezes.  Abdominal: Soft. Bowel sounds are normal. She exhibits no distension.  Musculoskeletal: Normal range of motion. She exhibits no edema.  Neurological: She is alert. She exhibits normal muscle tone.  Skin: Skin is warm and dry. She is not diaphoretic. No pallor.  Psychiatric: She has a normal mood and affect. Her behavior is normal. Judgment and thought content normal.       Assessment & Plan:   Problem List Items Addressed This Visit      Cardiovascular and Mediastinum   Hypertension - Primary    Excellent control today; DASH guidelines      Relevant Orders   Basic metabolic panel (Completed)     Endocrine   Hypothyroidism    Followed by endo; last TSH normal        Musculoskeletal and Integument   Osteoporosis    Fall precautions; three servings of calcium daily, 1000 vit D3 daily recommended; NOT more than that unless specifically instructed by MD; explained risk of too much vitamin D, as it is a fat soluble vitamin and can build up and become toxic      Relevant Orders   VITAMIN D 25 Hydroxy (Vit-D Deficiency, Fractures) (Completed)   Dermatitis    Dab after showers; avoid chemicals; refill TAC; call derm if worsening  Genitourinary   CKD (chronic kidney disease) stage 2, GFR 60-89 ml/min    Limit NSAIDs; check BMP today; stay hydrated         Follow up plan: Return in about 6 months (around 12/10/2015) for follow-up.  Orders Placed This Encounter  Procedures  . Basic metabolic panel  . VITAMIN D 25 Hydroxy (Vit-D Deficiency, Fractures)   An after-visit summary was printed and given to the patient at check-out.  Please see the patient instructions which may contain other information and recommendations beyond what is mentioned above in the assessment and plan.

## 2015-06-10 NOTE — Assessment & Plan Note (Signed)
Followed by endo; last TSH normal

## 2015-06-10 NOTE — Assessment & Plan Note (Signed)
Dab after showers; avoid chemicals; refill TAC; call derm if worsening

## 2015-06-11 LAB — BASIC METABOLIC PANEL
BUN/Creatinine Ratio: 18 (ref 12–28)
BUN: 16 mg/dL (ref 8–27)
CALCIUM: 9 mg/dL (ref 8.7–10.3)
CHLORIDE: 102 mmol/L (ref 96–106)
CO2: 26 mmol/L (ref 18–29)
CREATININE: 0.87 mg/dL (ref 0.57–1.00)
GFR calc non Af Amer: 70 mL/min/{1.73_m2} (ref >59)
GFR, EST AFRICAN AMERICAN: 81 mL/min/{1.73_m2} (ref >59)
Glucose: 116 mg/dL — ABNORMAL HIGH (ref 65–99)
Potassium: 4.1 mmol/L (ref 3.5–5.2)
Sodium: 143 mmol/L (ref 134–144)

## 2015-06-11 LAB — VITAMIN D 25 HYDROXY (VIT D DEFICIENCY, FRACTURES): VIT D 25 HYDROXY: 114 ng/mL — AB (ref 30.0–100.0)

## 2015-07-09 ENCOUNTER — Other Ambulatory Visit: Payer: Self-pay | Admitting: Family Medicine

## 2015-07-09 NOTE — Telephone Encounter (Signed)
rx approved

## 2015-07-11 ENCOUNTER — Other Ambulatory Visit: Payer: Self-pay

## 2015-07-11 NOTE — Telephone Encounter (Signed)
I received a request for premarin, but this was just approved in March for 3 month supply; please clarify with Kelby Alinedgewood; thanks

## 2015-07-13 ENCOUNTER — Other Ambulatory Visit: Payer: Self-pay | Admitting: Family Medicine

## 2015-07-15 ENCOUNTER — Telehealth: Payer: Self-pay | Admitting: Family Medicine

## 2015-07-15 DIAGNOSIS — M810 Age-related osteoporosis without current pathological fracture: Secondary | ICD-10-CM

## 2015-07-15 NOTE — Assessment & Plan Note (Signed)
Order bone scan

## 2015-07-15 NOTE — Telephone Encounter (Signed)
Pt notified will call back for scheduling number# [phone]

## 2015-07-15 NOTE — Telephone Encounter (Signed)
Insurance company is recommending patient get a bone density scan; please let patient know I entered order (future); she can call to schedule

## 2015-07-18 ENCOUNTER — Ambulatory Visit (INDEPENDENT_AMBULATORY_CARE_PROVIDER_SITE_OTHER): Payer: Managed Care, Other (non HMO) | Admitting: Family Medicine

## 2015-07-18 ENCOUNTER — Encounter: Payer: Self-pay | Admitting: Family Medicine

## 2015-07-18 VITALS — BP 116/78 | HR 87 | Temp 98.7°F | Resp 16 | Wt 136.0 lb

## 2015-07-18 DIAGNOSIS — R350 Frequency of micturition: Secondary | ICD-10-CM | POA: Diagnosis not present

## 2015-07-18 DIAGNOSIS — N182 Chronic kidney disease, stage 2 (mild): Secondary | ICD-10-CM

## 2015-07-18 DIAGNOSIS — M810 Age-related osteoporosis without current pathological fracture: Secondary | ICD-10-CM | POA: Diagnosis not present

## 2015-07-18 DIAGNOSIS — M25551 Pain in right hip: Secondary | ICD-10-CM

## 2015-07-18 LAB — POCT URINALYSIS DIPSTICK
BILIRUBIN UA: NEGATIVE
Blood, UA: NEGATIVE
Glucose, UA: NEGATIVE
KETONES UA: NEGATIVE
LEUKOCYTES UA: NEGATIVE
Nitrite, UA: NEGATIVE
PROTEIN UA: NEGATIVE
SPEC GRAV UA: 1.02
Urobilinogen, UA: 0.2
pH, UA: 5.5

## 2015-07-19 ENCOUNTER — Ambulatory Visit
Admission: RE | Admit: 2015-07-19 | Discharge: 2015-07-19 | Disposition: A | Discharge status: Home / Self Care | Payer: Managed Care, Other (non HMO) | Source: Ambulatory Visit | Attending: Family Medicine | Admitting: Family Medicine

## 2015-07-19 ENCOUNTER — Telehealth: Payer: Self-pay | Admitting: Family Medicine

## 2015-07-19 ENCOUNTER — Other Ambulatory Visit: Payer: Self-pay

## 2015-07-19 DIAGNOSIS — M1611 Unilateral primary osteoarthritis, right hip: Secondary | ICD-10-CM | POA: Diagnosis not present

## 2015-07-19 DIAGNOSIS — M25551 Pain in right hip: Secondary | ICD-10-CM | POA: Diagnosis present

## 2015-07-19 NOTE — Telephone Encounter (Signed)
Patient was returning your call

## 2015-07-19 NOTE — Telephone Encounter (Signed)
She does indeed have some arthritis in her right hip joint. There is a bone spur in there as well. If she would like to start with physical therapy, we can do that, or if she would like to see an orthopaedist, we can go that direction. It's up to how much it bothers her and

## 2015-07-20 ENCOUNTER — Telehealth: Payer: Self-pay | Admitting: Family Medicine

## 2015-07-20 MED ORDER — DICLOFENAC SODIUM 1 % TD GEL: 4.0000 g | Freq: Four times a day (QID) | TRANSDERMAL | Status: DC

## 2015-07-20 NOTE — Telephone Encounter (Signed)
PLEASE CAL PT BACK ABOUT RESULTS ON CELL PHONE

## 2015-07-20 NOTE — Telephone Encounter (Signed)
Pt wants to know if she can get an rx for anti inflammatory cream to help with hip pain?  She has an orthopedic doctor already she can call when she get back from out of town

## 2015-07-20 NOTE — Telephone Encounter (Signed)
Rx sent 

## 2015-07-20 NOTE — Telephone Encounter (Signed)
My staff talked with her earlier I understand; she will see ortho; requested the voltaren gel; sent

## 2015-07-25 ENCOUNTER — Ambulatory Visit: Payer: Managed Care, Other (non HMO) | Admitting: Podiatry

## 2015-07-25 ENCOUNTER — Ambulatory Visit: Payer: Managed Care, Other (non HMO)

## 2015-08-07 DIAGNOSIS — M25551 Pain in right hip: Secondary | ICD-10-CM | POA: Insufficient documentation

## 2015-08-07 NOTE — Assessment & Plan Note (Signed)
With hip pain; able to bear weight, so doubtful this is a fracture, but there could be insufficiency fracture; ordered xrays of right hip

## 2015-08-07 NOTE — Assessment & Plan Note (Signed)
Trying to limit oral NSAIDs as much as possible

## 2015-08-07 NOTE — Progress Notes (Signed)
BP 116/78 mmHg  Pulse 87  Temp(Src) 98.7 F (37.1 C) (Oral)  Resp 16  Wt 136 lb (61.689 kg)  SpO2 95%   Subjective:    Patient ID: Lauren Benson, female    DOB: Jul 14, 1950, 65 y.o.   MRN: [id number]  HPI: Lauren Benson is a 65 y.o. female  Chief Complaint  Patient presents with  . Hip Pain    right radiates some into the leg.  As patient walks more it gets stiff.  Has already seen chiropractor with no relief.   Patient was very briefly seen for hip pain; doctor was running behind, so she shared with me that she hs having some pain in the right hip; walking makes it worse; stiffness in the right hip joint; no sore to the touch externally, but deeper in the joint; has already seen chiropractor, but not much improvement; she is open to getting xrays; she has already been diagnosed with arthritis of the hand; she takes an NSAID a few times a week; wonders if she could use something topical A urine was obtained by the staff; unremarkable  Relevant past medical, surgical, family and social history reviewed Past Medical History  Diagnosis Date  . Thyroid condition   . Osteoporosis   . Hypertension   . Hypothyroidism   . Osteoarthritis of hand   . GERD (gastroesophageal reflux disease)   . Symptomatic menopausal or female climacteric states   . Lumbago   . History of rheumatic fever   . Fibrocystic breast disease    Past Surgical History  Procedure Laterality Date  . Breast lumpectomy Right   . Vaginal hysterectomy  1991    total  . Cesarean section      x 2  . Dilation and curettage of uterus      multiple times  . Breast lumpectomy Right    Interim medical history since last visit reviewed. Allergies and medications reviewed  Review of Systems Per HPI unless specifically indicated above     Objective:    BP 116/78 mmHg  Pulse 87  Temp(Src) 98.7 F (37.1 C) (Oral)  Resp 16  Wt 136 lb (61.689 kg)  SpO2 95%  Wt Readings from Last 3 Encounters:    07/18/15 136 lb (61.689 kg)  06/10/15 141 lb (63.957 kg)  08/30/14 134 lb (60.782 kg)    Physical Exam  Constitutional: She appears well-developed and well-nourished. No distress.  Cardiovascular: Normal rate.   Pulmonary/Chest: Effort normal.  Musculoskeletal:       Right hip: She exhibits decreased range of motion.  Neurological: Gait normal.   Results for orders placed or performed in visit on 07/18/15  POCT urinalysis dipstick  Result Value Ref Range   Color, UA light yellow    Clarity, UA clear    Glucose, UA neg    Bilirubin, UA neg    Ketones, UA neg    Spec Grav, UA 1.020    Blood, UA neg    pH, UA 5.5    Protein, UA neg    Urobilinogen, UA 0.2    Nitrite, UA neg    Leukocytes, UA Negative Negative      Assessment & Plan:   Problem List Items Addressed This Visit      Musculoskeletal and Integument   Osteoporosis    With hip pain; able to bear weight, so doubtful this is a fracture, but there could be insufficiency fracture; ordered xrays of right hip  Genitourinary   CKD (chronic kidney disease) stage 2, GFR 60-89 ml/min    Trying to limit oral NSAIDs as much as possible        Other   Right hip pain - Primary    Ordering xrays of the right hip; consider physical therapy and/or ortho after viewing report; may continue oral NSAID; not sure how effective voltaren gel would be given the depth to joint, but might help if trochanteric bursitis       Other Visit Diagnoses    Urinary frequency        Relevant Orders    POCT urinalysis dipstick (Completed)       Follow up plan: No Follow-up on file.  An after-visit summary was printed and given to the patient at check-out.  Please see the patient instructions which may contain other information and recommendations beyond what is mentioned above in the assessment and plan.  Meds ordered this encounter  Medications  . ranitidine (ZANTAC) 300 MG tablet    Sig:    Orders Placed This Encounter   Procedures  . POCT urinalysis dipstick

## 2015-08-07 NOTE — Assessment & Plan Note (Signed)
Ordering xrays of the right hip; consider physical therapy and/or ortho after viewing report; may continue oral NSAID; not sure how effective voltaren gel would be given the depth to joint, but might help if trochanteric bursitis

## 2015-08-08 ENCOUNTER — Ambulatory Visit
Admission: RE | Admit: 2015-08-08 | Discharge: 2015-08-08 | Disposition: A | Discharge status: Home / Self Care | Payer: Managed Care, Other (non HMO) | Source: Ambulatory Visit | Attending: Family Medicine | Admitting: Family Medicine

## 2015-08-08 DIAGNOSIS — M8580 Other specified disorders of bone density and structure, unspecified site: Secondary | ICD-10-CM | POA: Insufficient documentation

## 2015-08-08 DIAGNOSIS — Z13820 Encounter for screening for osteoporosis: Secondary | ICD-10-CM | POA: Insufficient documentation

## 2015-08-08 DIAGNOSIS — M810 Age-related osteoporosis without current pathological fracture: Secondary | ICD-10-CM

## 2015-08-11 ENCOUNTER — Telehealth: Payer: Self-pay

## 2015-08-11 ENCOUNTER — Other Ambulatory Visit: Payer: Self-pay | Admitting: Family Medicine

## 2015-08-11 DIAGNOSIS — E038 Other specified hypothyroidism: Secondary | ICD-10-CM

## 2015-08-11 DIAGNOSIS — R5383 Other fatigue: Secondary | ICD-10-CM

## 2015-08-11 DIAGNOSIS — Z1239 Encounter for other screening for malignant neoplasm of breast: Secondary | ICD-10-CM | POA: Insufficient documentation

## 2015-08-11 NOTE — Telephone Encounter (Signed)
I sent the Rx as requested, but we really like to get yearly mammograms on women, especially those who are taking hormone replacement therapy (HRT) Please ask her to schedule her mammogram soon; thank you

## 2015-08-11 NOTE — Telephone Encounter (Signed)
Please review bone density

## 2015-08-11 NOTE — Telephone Encounter (Signed)
Pt.notified

## 2015-08-12 DIAGNOSIS — R5383 Other fatigue: Secondary | ICD-10-CM | POA: Insufficient documentation

## 2015-08-12 NOTE — Telephone Encounter (Signed)
Osteopenia on DEXA I left message, calling about scan; will try her another time

## 2015-08-12 NOTE — Assessment & Plan Note (Signed)
Check level 

## 2015-08-12 NOTE — Telephone Encounter (Signed)
Patient called back; she had bone spurs on the spine, so I suspect that high T-score is arthritis, bone spurs causing false elevation Estrogen refilled; she does not do well without it; she is on the lowest dose; suggested skipping just one day a week for 2-3 months, then skip two non-consecutive days a week for 2-3 months, and ease off very gradually; she is not willing to give up; discussed WHI study results that came out Weight-bearing exercise, 1000 to 1200 mg calcium (best in food, supplements controversial), vitamin D level was high last time, fall precautions encouraged Taking B12 complex; just feeling like she's dragging; not sleeping as good; seemed like it started when she quit taking the vitamin D Start back on vit D in July I offered labs here next week; she'll stop by to get orders, make appt if needed

## 2015-08-31 ENCOUNTER — Ambulatory Visit: Payer: Managed Care, Other (non HMO)

## 2015-08-31 ENCOUNTER — Ambulatory Visit
Admission: RE | Admit: 2015-08-31 | Discharge: 2015-08-31 | Disposition: A | Discharge status: Home / Self Care | Payer: Managed Care, Other (non HMO) | Source: Ambulatory Visit | Attending: Family Medicine | Admitting: Family Medicine

## 2015-08-31 DIAGNOSIS — Z1231 Encounter for screening mammogram for malignant neoplasm of breast: Secondary | ICD-10-CM | POA: Diagnosis not present

## 2015-08-31 DIAGNOSIS — Z1239 Encounter for other screening for malignant neoplasm of breast: Secondary | ICD-10-CM

## 2015-09-13 ENCOUNTER — Other Ambulatory Visit: Payer: Self-pay | Admitting: Family Medicine

## 2015-09-13 NOTE — Telephone Encounter (Signed)
mammo done last month; Rx approved

## 2015-12-09 ENCOUNTER — Ambulatory Visit: Payer: Managed Care, Other (non HMO) | Admitting: Family Medicine

## 2015-12-13 ENCOUNTER — Ambulatory Visit (INDEPENDENT_AMBULATORY_CARE_PROVIDER_SITE_OTHER): Payer: Managed Care, Other (non HMO) | Admitting: Family Medicine

## 2015-12-13 ENCOUNTER — Encounter: Payer: Self-pay | Admitting: Family Medicine

## 2015-12-13 VITALS — BP 118/80 | HR 86 | Temp 98.1°F | Resp 15 | Ht 67.0 in | Wt 140.3 lb

## 2015-12-13 DIAGNOSIS — M62830 Muscle spasm of back: Secondary | ICD-10-CM | POA: Diagnosis not present

## 2015-12-13 MED ORDER — TIZANIDINE HCL 2 MG PO CAPS: 2.0000 mg | ORAL_CAPSULE | Freq: Three times a day (TID) | ORAL | 0 refills | Status: DC | PRN

## 2015-12-13 NOTE — Progress Notes (Signed)
Name: Lauren Benson   MRN: [id number]    DOB: 11-Jul-1950   Date:12/13/2015       Progress Note  Subjective  Chief Complaint  Chief Complaint  Patient presents with  . Acute Visit    possible UTI  This patient is usually followed by Dr. Sherie DonLada, new to me  Back Pain  This is a chronic problem. The problem occurs intermittently. The problem has been waxing and waning since onset. The pain is present in the lumbar spine. The quality of the pain is described as aching. The pain is at a severity of 5/10 (pain can fluctuate). Pertinent negatives include no bladder incontinence, bowel incontinence, leg pain, numbness, paresthesias or tingling. She has tried analgesics (Tylenol as needed) for the symptoms. The treatment provided moderate relief.   Chronic low back pain, worse recently. Feels heaviness in the lower back, no radicular symptoms. Patient has history of degenerative disc disease and herniated nucleus pulposis based on MRI of lumbar spine from 2014.  Past Medical History:  Diagnosis Date  . Fibrocystic breast disease   . GERD (gastroesophageal reflux disease)   . History of rheumatic fever   . Hypertension   . Hypothyroidism   . Lumbago   . Osteoarthritis of hand   . Osteopenia   . Osteoporosis   . Symptomatic menopausal or female climacteric states   . Thyroid condition     Past Surgical History:  Procedure Laterality Date  . BREAST LUMPECTOMY Right   . BREAST LUMPECTOMY Right   . CESAREAN SECTION     x 2  . DILATION AND CURETTAGE OF UTERUS     multiple times  . VAGINAL HYSTERECTOMY  1991   total    Family History  Problem Relation Age of Onset  . Alzheimer's disease Mother   . Cancer Brother     mesothelioma  . Cancer Maternal Aunt     breast  . Breast cancer Maternal Aunt     Social History   Social History  . Marital status: Married    Spouse name: N/A  . Number of children: N/A  . Years of education: N/A   Occupational History  . Not on file.     Social History Main Topics  . Smoking status: Former Smoker    Quit date: 10/03/2008  . Smokeless tobacco: Never Used  . Alcohol use No  . Drug use: No  . Sexual activity: Not on file   Other Topics Concern  . Not on file   Social History Narrative  . No narrative on file     Current Outpatient Prescriptions:  .  gabapentin (NEURONTIN) 400 MG capsule, Take 1 capsule (400 mg total) by mouth at bedtime., Disp: 30 capsule, Rfl: 11 .  meloxicam (MOBIC) 7.5 MG tablet, TAKE 1 TABLET DAILY 3 OR 4 TIMES PER WEEK IF NEEDED FOR INFLAMMATORY PAIN-- TAKE WITH FOOD OR MILK, Disp: 16 tablet, Rfl: 5 .  PREMARIN 0.3 MG tablet, TAKE 1 TABLET BY MOUTH DAILY, Disp: 30 tablet, Rfl: 11 .  ranitidine (ZANTAC) 300 MG tablet, , Disp: , Rfl:  .  SYNTHROID 100 MCG tablet, Take 100 mcg by mouth daily before breakfast. , Disp: , Rfl:  .  diclofenac sodium (VOLTAREN) 1 % GEL, Apply 4 g topically 4 (four) times daily. Over affected joint, as needed (Patient not taking: Reported on 12/13/2015), Disp: 100 g, Rfl: 1  Allergies  Allergen Reactions  . Codeine Nausea Only     Review of Systems  Gastrointestinal: Negative for bowel incontinence.  Genitourinary: Negative for bladder incontinence.  Musculoskeletal: Positive for back pain.  Neurological: Negative for tingling, numbness and paresthesias.    Objective  Vitals:   12/13/15 1346  BP: 118/80  Pulse: 86  Resp: 15  Temp: 98.1 F (36.7 C)  TempSrc: Oral  SpO2: 96%  Weight: 140 lb 4.8 oz (63.6 kg)  Height: 5\' 7"  (1.702 m)    Physical Exam  Constitutional: She is oriented to person, place, and time and well-developed, well-nourished, and in no distress.  HENT:  Head: Normocephalic and atraumatic.  Musculoskeletal:       Lumbar back: She exhibits spasm. Tenderness: mild tenderness associated with muscle spasm in the lower back transversely.       Back:  SLR negative, normal tone and sensation in lower extremities.  Neurological: She is  alert and oriented to person, place, and time.  Psychiatric: Mood, memory, affect and judgment normal.  Nursing note and vitals reviewed.   Assessment & Plan  1. Spasm of lumbar paraspinous muscle MRI from 2014 reviewed, appears acute on chronic exacerbation of low back pain with associated muscle spasm, will start on tizanidine 2 mg up to 3 times daily when needed, follow up with spine specialist. - tizanidine (ZANAFLEX) 2 MG capsule; Take 1 capsule (2 mg total) by mouth 3 (three) times daily as needed for muscle spasms.  Dispense: 10 capsule; Refill: 0   Krzysztof Reichelt Asad [facility] 12/13/2015 2:03 PM

## 2015-12-16 ENCOUNTER — Ambulatory Visit: Payer: Managed Care, Other (non HMO) | Admitting: Family Medicine

## 2016-01-02 ENCOUNTER — Ambulatory Visit (INDEPENDENT_AMBULATORY_CARE_PROVIDER_SITE_OTHER): Payer: Managed Care, Other (non HMO) | Admitting: Family Medicine

## 2016-01-02 ENCOUNTER — Encounter: Payer: Self-pay | Admitting: Family Medicine

## 2016-01-02 DIAGNOSIS — N182 Chronic kidney disease, stage 2 (mild): Secondary | ICD-10-CM

## 2016-01-02 DIAGNOSIS — M62830 Muscle spasm of back: Secondary | ICD-10-CM | POA: Diagnosis not present

## 2016-01-02 DIAGNOSIS — E038 Other specified hypothyroidism: Secondary | ICD-10-CM

## 2016-01-02 MED ORDER — TIZANIDINE HCL 2 MG PO CAPS: 2.0000 mg | ORAL_CAPSULE | Freq: Three times a day (TID) | ORAL | 2 refills | Status: DC | PRN

## 2016-01-02 NOTE — Assessment & Plan Note (Signed)
Check BMP in Dec with other labs; limit NSAIDs; stay hydrated

## 2016-01-02 NOTE — Progress Notes (Signed)
BP 112/78   Pulse 82   Temp 98.1 F (36.7 C) (Oral)   Resp 14   Wt 140 lb (63.5 kg)   SpO2 97%   BMI 21.93 kg/m    Subjective:    Patient ID: Lauren Benson, female    DOB: 1950-06-16, 65 y.o.   MRN: [id number]  HPI: Lauren Benson is a 65 y.o. female  Chief Complaint  Patient presents with  . Follow-up  . Medication Refill   She saw my colleague a few weeks ago; waking up with leg cramps; the medicine he gave her provided her great relief and helped her sleep  She thought it was a bladder infection but Dr. Sherryll BurgerShah said her urine was fine  Limiting her NSAIDs; taking meloxicam just 3 days a week; trying to drink water  Vitamin D was high, 114, so she quit taking any supplemental vitamin D for 3 months and then started taking 1000 iu daily  Hypothyroidism; followed by endocrinologist  [facility] 01/02/2016 12/13/2015 06/10/2015 08/30/2014  Decreased Interest 0 0 0 0  Down, Depressed, Hopeless 0 0 0 0  PHQ - 2 Score 0 0 0 0   Relevant past medical, surgical, family and social history reviewed Past Medical History:  Diagnosis Date  . Fibrocystic breast disease   . GERD (gastroesophageal reflux disease)   . History of rheumatic fever   . Hypertension   . Hypothyroidism   . Lumbago   . Osteoarthritis of hand   . Osteopenia   . Osteoporosis   . Symptomatic menopausal or female climacteric states   . Thyroid condition    Past Surgical History:  Procedure Laterality Date  . BREAST LUMPECTOMY Right   . BREAST LUMPECTOMY Right   . CESAREAN SECTION     x 2  . DILATION AND CURETTAGE OF UTERUS     multiple times  . VAGINAL HYSTERECTOMY  1991   total   Family History  Problem Relation Age of Onset  . Alzheimer's disease Mother   . Cancer Brother     mesothelioma  . Cancer Maternal Aunt     breast  . Breast cancer Maternal Aunt    Social History  Substance Use Topics  . Smoking status: Former Smoker    Quit date: 10/03/2008  . Smokeless  tobacco: Never Used  . Alcohol use No   Interim medical history since last visit reviewed. Allergies and medications reviewed  Review of Systems Per HPI unless specifically indicated above     Objective:    BP 112/78   Pulse 82   Temp 98.1 F (36.7 C) (Oral)   Resp 14   Wt 140 lb (63.5 kg)   SpO2 97%   BMI 21.93 kg/m   Wt Readings from Last 3 Encounters:  01/02/16 140 lb (63.5 kg)  12/13/15 140 lb 4.8 oz (63.6 kg)  07/18/15 136 lb (61.7 kg)    Physical Exam  Constitutional: She appears well-developed and well-nourished. No distress.  Eyes: EOM are normal. No scleral icterus.  Neck: No thyromegaly present.  Cardiovascular: Normal rate and regular rhythm.   Pulmonary/Chest: Effort normal and breath sounds normal.  Abdominal: She exhibits no distension.  Skin: No pallor.  Psychiatric: She has a normal mood and affect. Her behavior is normal. Judgment and thought content normal.   Results for orders placed or performed in visit on 07/18/15  POCT urinalysis dipstick  Result Value Ref Range   Color, UA light yellow  Clarity, UA clear    Glucose, UA neg    Bilirubin, UA neg    Ketones, UA neg    Spec Grav, UA 1.020    Blood, UA neg    pH, UA 5.5    Protein, UA neg    Urobilinogen, UA 0.2    Nitrite, UA neg    Leukocytes, UA Negative Negative      Assessment & Plan:   Problem List Items Addressed This Visit      Endocrine   Hypothyroidism (Chronic)    Managed by endo        Genitourinary   CKD (chronic kidney disease) stage 2, GFR 60-89 ml/min (Chronic)    Check BMP in Dec with other labs; limit NSAIDs; stay hydrated      Relevant Orders   Basic metabolic panel    Other Visit Diagnoses    Spasm of lumbar paraspinous muscle       Relevant Medications   tizanidine (ZANAFLEX) 2 MG capsule      Follow up plan: Return in about 6 months (around 07/02/2016) for Medicare visit.  An after-visit summary was printed and given to the patient at check-out.   Please see the patient instructions which may contain other information and recommendations beyond what is mentioned above in the assessment and plan.  Meds ordered this encounter  Medications  . tizanidine (ZANAFLEX) 2 MG capsule    Sig: Take 1 capsule (2 mg total) by mouth 3 (three) times daily as needed for muscle spasms.    Dispense:  30 capsule    Refill:  2    Orders Placed This Encounter  Procedures  . Basic metabolic panel   Face-to-face time with patient was more than 15 minutes, >50% time spent counseling and coordination of care

## 2016-01-02 NOTE — Patient Instructions (Addendum)
Please have labs drawn in December and results sent here  Try to get some vitamin B12  Lauren Benson Gluten Free Description Good Source of B Vitamins  Great on Popcorn  Gluten Free  Vegetarian  Kosher

## 2016-01-08 NOTE — Assessment & Plan Note (Signed)
Managed by endo

## 2016-01-11 ENCOUNTER — Telehealth: Payer: Self-pay | Admitting: Family Medicine

## 2016-01-11 NOTE — Telephone Encounter (Signed)
Please call patient [facility] She is still taking estrogen (premarin) so we should recommend that she taper off of her HRT I'll suggest that she skip one day a week for a few weeks, then skip two days a week for a few weeks, then so on until she weans off, hopefully over the next 3-6 months Thank you

## 2016-01-12 NOTE — Telephone Encounter (Signed)
Patient states that you all have had this conversation in the past and you were fine with her taking the lowest dose.  Please advise?

## 2016-01-12 NOTE — Telephone Encounter (Signed)
We did talk before, but we're now recommending that she do a gradual taper off We need to keep trying to decrease I'll suggest that she start to taper as discussed Thank you

## 2016-01-13 ENCOUNTER — Telehealth: Payer: Self-pay

## 2016-01-13 NOTE — Telephone Encounter (Signed)
Patient called back states wants to stay on premarin until her f/u in may and then you all can discuss?  Patient states call her with any concerns

## 2016-01-13 NOTE — Telephone Encounter (Signed)
Left detailed voicemail

## 2016-01-16 NOTE — Telephone Encounter (Signed)
As long as she is aware of risks and my concern, I'll leave this up to her; we'll discuss again next year

## 2016-02-15 ENCOUNTER — Other Ambulatory Visit: Payer: Self-pay | Admitting: Family Medicine

## 2016-02-21 ENCOUNTER — Other Ambulatory Visit: Payer: Self-pay | Admitting: Family Medicine

## 2016-02-21 ENCOUNTER — Other Ambulatory Visit: Payer: Self-pay

## 2016-02-21 MED ORDER — MELOXICAM 7.5 MG PO TABS: ORAL_TABLET | ORAL | 0 refills | Status: DC

## 2016-02-21 NOTE — Telephone Encounter (Signed)
Please remind patient of outstanding labs I sent refill as requested Thank you

## 2016-02-21 NOTE — Telephone Encounter (Signed)
I spoke with Patient and she [facility] and the only thing she was able to get drawn was her thyroid. She was little confuse on where she need to go in order to have them drawn. I mention to patient that she can come here to have them done. She agreed. I print put her lab report; its up front.

## 2016-02-22 LAB — BASIC METABOLIC PANEL
BUN: 20 mg/dL (ref 7–25)
CALCIUM: 9.2 mg/dL (ref 8.6–10.4)
CO2: 27 mmol/L (ref 20–31)
CREATININE: 1.16 mg/dL — AB (ref 0.50–0.99)
Chloride: 102 mmol/L (ref 98–110)
GLUCOSE: 164 mg/dL — AB (ref 65–99)
POTASSIUM: 4.2 mmol/L (ref 3.5–5.3)
Sodium: 141 mmol/L (ref 135–146)

## 2016-02-28 ENCOUNTER — Other Ambulatory Visit: Payer: Self-pay | Admitting: Family Medicine

## 2016-02-28 DIAGNOSIS — N182 Chronic kidney disease, stage 2 (mild): Secondary | ICD-10-CM

## 2016-02-28 NOTE — Progress Notes (Signed)
Bmp in 3 months

## 2016-02-28 NOTE — Assessment & Plan Note (Signed)
Recheck bmp in 3 months; limit nsaids, hydrate, limit animal protein

## 2016-03-20 ENCOUNTER — Other Ambulatory Visit: Payer: Self-pay | Admitting: Family Medicine

## 2016-03-21 NOTE — Telephone Encounter (Signed)
Last Cr bumped up a bit; watching closely; recheck Cr soon, limiting NSAIDs

## 2016-04-05 ENCOUNTER — Other Ambulatory Visit: Payer: Self-pay | Admitting: Family Medicine

## 2016-04-05 DIAGNOSIS — M62830 Muscle spasm of back: Secondary | ICD-10-CM

## 2016-05-19 ENCOUNTER — Other Ambulatory Visit: Payer: Self-pay | Admitting: Family Medicine

## 2016-05-20 NOTE — Telephone Encounter (Signed)
Labs due in 1-2 weeks

## 2016-06-05 ENCOUNTER — Other Ambulatory Visit: Payer: Self-pay | Admitting: Family Medicine

## 2016-06-05 DIAGNOSIS — M62830 Muscle spasm of back: Secondary | ICD-10-CM

## 2016-06-21 ENCOUNTER — Other Ambulatory Visit: Payer: Self-pay | Admitting: Family Medicine

## 2016-07-03 ENCOUNTER — Ambulatory Visit (INDEPENDENT_AMBULATORY_CARE_PROVIDER_SITE_OTHER): Payer: Managed Care, Other (non HMO) | Admitting: Family Medicine

## 2016-07-03 ENCOUNTER — Encounter: Payer: Self-pay | Admitting: Family Medicine

## 2016-07-03 VITALS — BP 110/76 | HR 84 | Temp 98.4°F | Resp 14 | Ht 65.3 in | Wt 141.2 lb

## 2016-07-03 DIAGNOSIS — Z0000 Encounter for general adult medical examination without abnormal findings: Secondary | ICD-10-CM

## 2016-07-03 DIAGNOSIS — T452X1A Poisoning by vitamins, accidental (unintentional), initial encounter: Secondary | ICD-10-CM | POA: Insufficient documentation

## 2016-07-03 DIAGNOSIS — L538 Other specified erythematous conditions: Secondary | ICD-10-CM

## 2016-07-03 DIAGNOSIS — H6121 Impacted cerumen, right ear: Secondary | ICD-10-CM | POA: Diagnosis not present

## 2016-07-03 DIAGNOSIS — N6320 Unspecified lump in the left breast, unspecified quadrant: Secondary | ICD-10-CM | POA: Diagnosis not present

## 2016-07-03 DIAGNOSIS — Z79890 Hormone replacement therapy (postmenopausal): Secondary | ICD-10-CM | POA: Diagnosis not present

## 2016-07-03 DIAGNOSIS — Z1239 Encounter for other screening for malignant neoplasm of breast: Secondary | ICD-10-CM

## 2016-07-03 DIAGNOSIS — Z1231 Encounter for screening mammogram for malignant neoplasm of breast: Secondary | ICD-10-CM | POA: Diagnosis not present

## 2016-07-03 DIAGNOSIS — E038 Other specified hypothyroidism: Secondary | ICD-10-CM

## 2016-07-03 DIAGNOSIS — N182 Chronic kidney disease, stage 2 (mild): Secondary | ICD-10-CM

## 2016-07-03 DIAGNOSIS — M8580 Other specified disorders of bone density and structure, unspecified site: Secondary | ICD-10-CM | POA: Diagnosis not present

## 2016-07-03 DIAGNOSIS — T452X1D Poisoning by vitamins, accidental (unintentional), subsequent encounter: Secondary | ICD-10-CM

## 2016-07-03 LAB — COMPLETE METABOLIC PANEL WITH GFR
ALT: 15 U/L (ref 6–29)
AST: 20 U/L (ref 10–35)
Albumin: 3.9 g/dL (ref 3.6–5.1)
Alkaline Phosphatase: 49 U/L (ref 33–130)
BUN: 19 mg/dL (ref 7–25)
CALCIUM: 8.9 mg/dL (ref 8.6–10.4)
CHLORIDE: 103 mmol/L (ref 98–110)
CO2: 29 mmol/L (ref 20–31)
Creat: 1.2 mg/dL — ABNORMAL HIGH (ref 0.50–0.99)
GFR, EST AFRICAN AMERICAN: 54 mL/min — AB (ref >=60)
GFR, Est Non African American: 47 mL/min — ABNORMAL LOW (ref >=60)
Glucose, Bld: 88 mg/dL (ref 65–99)
POTASSIUM: 4.6 mmol/L (ref 3.5–5.3)
Sodium: 140 mmol/L (ref 135–146)
Total Bilirubin: 0.3 mg/dL (ref 0.2–1.2)
Total Protein: 6.5 g/dL (ref 6.1–8.1)

## 2016-07-03 LAB — CBC WITH DIFFERENTIAL/PLATELET
Basophils Absolute: 84 cells/uL (ref 0–200)
Basophils Relative: 1 %
Eosinophils Absolute: 252 cells/uL (ref 15–500)
Eosinophils Relative: 3 %
HEMATOCRIT: 43.3 % (ref 35.0–45.0)
Hemoglobin: 14 g/dL (ref 11.7–15.5)
LYMPHS PCT: 30 %
Lymphs Abs: 2520 cells/uL (ref 850–3900)
MCH: 29.8 pg (ref 27.0–33.0)
MCHC: 32.3 g/dL (ref 32.0–36.0)
MCV: 92.1 fL (ref 80.0–100.0)
MONO ABS: 924 {cells}/uL (ref 200–950)
MPV: 10.3 fL (ref 7.5–12.5)
Monocytes Relative: 11 %
NEUTROS PCT: 55 %
Neutro Abs: 4620 cells/uL (ref 1500–7800)
PLATELETS: 261 10*3/uL (ref 140–400)
RBC: 4.7 MIL/uL (ref 3.80–5.10)
RDW: 14.1 % (ref 11.0–15.0)
WBC: 8.4 10*3/uL (ref 3.8–10.8)

## 2016-07-03 LAB — LIPID PANEL
CHOL/HDL RATIO: 2.5 ratio (ref <5.0)
CHOLESTEROL: 183 mg/dL (ref <200)
HDL: 74 mg/dL (ref >50)
LDL Cholesterol: 75 mg/dL (ref <100)
Triglycerides: 169 mg/dL — ABNORMAL HIGH (ref <150)
VLDL: 34 mg/dL — ABNORMAL HIGH (ref <30)

## 2016-07-03 MED ORDER — MELOXICAM 7.5 MG PO TABS: 3.7500 mg | ORAL_TABLET | Freq: Every day | ORAL | 2 refills | Status: AC

## 2016-07-03 MED ORDER — ESTROGENS CONJUGATED 0.3 MG PO TABS: ORAL_TABLET | ORAL | 0 refills | Status: DC

## 2016-07-03 NOTE — Assessment & Plan Note (Signed)
Check kidney function 

## 2016-07-03 NOTE — Assessment & Plan Note (Signed)
Hx of underactive thyroid; sees Dr. Aliene Altes at [facility]

## 2016-07-03 NOTE — Patient Instructions (Addendum)
Let's get scans of the left breast Health Maintenance, Female Adopting a healthy lifestyle and getting preventive care can go a long way to promote health and wellness. Talk with your health care provider about what schedule of regular examinations is right for you. This is a good chance for you to check in with your provider about disease prevention and staying healthy. In between checkups, there are plenty of things you can do on your own. Experts have done a lot of research about which lifestyle changes and preventive measures are most likely to keep you healthy. Ask your health care provider for more information. Weight and diet Eat a healthy diet  Be sure to include plenty of vegetables, fruits, low-fat dairy products, and lean protein.  Do not eat a lot of foods high in solid fats, added sugars, or salt.  Get regular exercise. This is one of the most important things you can do for your health.  Most adults should exercise for at least 150 minutes each week. The exercise should increase your heart rate and make you sweat (moderate-intensity exercise).  Most adults should also do strengthening exercises at least twice a week. This is in addition to the moderate-intensity exercise. Maintain a healthy weight  Body mass index (BMI) is a measurement that can be used to identify possible weight problems. It estimates body fat based on height and weight. Your health care provider can help determine your BMI and help you achieve or maintain a healthy weight.  For females 35 years of age and older:  A BMI below 18.5 is considered underweight.  A BMI of 18.5 to 24.9 is normal.  A BMI of 25 to 29.9 is considered overweight.  A BMI of 30 and above is considered obese. Watch levels of cholesterol and blood lipids  You should start having your blood tested for lipids and cholesterol at 67 years of age, then have this test every 5 years.  You may need to have your cholesterol levels checked  more often if:  Your lipid or cholesterol levels are high.  You are older than 67 years of age.  You are at high risk for heart disease. Cancer screening Lung Cancer  Lung cancer screening is recommended for adults 64-69 years old who are at high risk for lung cancer because of a history of smoking.  A yearly low-dose CT scan of the lungs is recommended for people who:  Currently smoke.  Have quit within the past 15 years.  Have at least a 30-pack-year history of smoking. A pack year is smoking an average of one pack of cigarettes a day for 1 year.  Yearly screening should continue until it has been 15 years since you quit.  Yearly screening should stop if you develop a health problem that would prevent you from having lung cancer treatment. Breast Cancer  Practice breast self-awareness. This means understanding how your breasts normally appear and feel.  It also means doing regular breast self-exams. Let your health care provider know about any changes, no matter how small.  If you are in your 20s or 30s, you should have a clinical breast exam (CBE) by a health care provider every 1-3 years as part of a regular health exam.  If you are 28 or older, have a CBE every year. Also consider having a breast X-ray (mammogram) every year.  If you have a family history of breast cancer, talk to your health care provider about genetic screening.  If you are  at high risk for breast cancer, talk to your health care provider about having an MRI and a mammogram every year.  Breast cancer gene (BRCA) assessment is recommended for women who have family members with BRCA-related cancers. BRCA-related cancers include:  Breast.  Ovarian.  Tubal.  Peritoneal cancers.  Results of the assessment will determine the need for genetic counseling and BRCA1 and BRCA2 testing. Cervical Cancer  Your health care provider may recommend that you be screened regularly for cancer of the pelvic organs  (ovaries, uterus, and vagina). This screening involves a pelvic examination, including checking for microscopic changes to the surface of your cervix (Pap test). You may be encouraged to have this screening done every 3 years, beginning at age 54.  For women ages 14-65, health care providers may recommend pelvic exams and Pap testing every 3 years, or they may recommend the Pap and pelvic exam, combined with testing for human papilloma virus (HPV), every 5 years. Some types of HPV increase your risk of cervical cancer. Testing for HPV may also be done on women of any age with unclear Pap test results.  Other health care providers may not recommend any screening for nonpregnant women who are considered low risk for pelvic cancer and who do not have symptoms. Ask your health care provider if a screening pelvic exam is right for you.  If you have had past treatment for cervical cancer or a condition that could lead to cancer, you need Pap tests and screening for cancer for at least 20 years after your treatment. If Pap tests have been discontinued, your risk factors (such as having a new sexual partner) need to be reassessed to determine if screening should resume. Some women have medical problems that increase the chance of getting cervical cancer. In these cases, your health care provider may recommend more frequent screening and Pap tests. Colorectal Cancer  This type of cancer can be detected and often prevented.  Routine colorectal cancer screening usually begins at 67 years of age and continues through 67 years of age.  Your health care provider may recommend screening at an earlier age if you have risk factors for colon cancer.  Your health care provider may also recommend using home test kits to check for hidden blood in the stool.  A small camera at the end of a tube can be used to examine your colon directly (sigmoidoscopy or colonoscopy). This is done to check for the earliest forms of  colorectal cancer.  Routine screening usually begins at age 19.  Direct examination of the colon should be repeated every 5-10 years through 67 years of age. However, you may need to be screened more often if early forms of precancerous polyps or small growths are found. Skin Cancer  Check your skin from head to toe regularly.  Tell your health care provider about any new moles or changes in moles, especially if there is a change in a mole's shape or color.  Also tell your health care provider if you have a mole that is larger than the size of a pencil eraser.  Always use sunscreen. Apply sunscreen liberally and repeatedly throughout the day.  Protect yourself by wearing long sleeves, pants, a wide-brimmed hat, and sunglasses whenever you are outside. Heart disease, diabetes, and high blood pressure  High blood pressure causes heart disease and increases the risk of stroke. High blood pressure is more likely to develop in:  People who have blood pressure in the high end of  the normal range (130-139/85-89 mm Hg).  People who are overweight or obese.  People who are African American.  If you are 58-46 years of age, have your blood pressure checked every 3-5 years. If you are 35 years of age or older, have your blood pressure checked every year. You should have your blood pressure measured twice-once when you are at a hospital or clinic, and once when you are not at a hospital or clinic. Record the average of the two measurements. To check your blood pressure when you are not at a hospital or clinic, you can use:  An automated blood pressure machine at a pharmacy.  A home blood pressure monitor.  If you are between 35 years and 66 years old, ask your health care provider if you should take aspirin to prevent strokes.  Have regular diabetes screenings. This involves taking a blood sample to check your fasting blood sugar level.  If you are at a normal weight and have a low risk for  diabetes, have this test once every three years after 67 years of age.  If you are overweight and have a high risk for diabetes, consider being tested at a younger age or more often. Preventing infection Hepatitis B  If you have a higher risk for hepatitis B, you should be screened for this virus. You are considered at high risk for hepatitis B if:  You were born in a country where hepatitis B is common. Ask your health care provider which countries are considered high risk.  Your parents were born in a high-risk country, and you have not been immunized against hepatitis B (hepatitis B vaccine).  You have HIV or AIDS.  You use needles to inject street drugs.  You live with someone who has hepatitis B.  You have had sex with someone who has hepatitis B.  You get hemodialysis treatment.  You take certain medicines for conditions, including cancer, organ transplantation, and autoimmune conditions. Hepatitis C  Blood testing is recommended for:  Everyone born from [birth year] through 1966.  Anyone with known risk factors for hepatitis C. Sexually transmitted infections (STIs)  You should be screened for sexually transmitted infections (STIs) including gonorrhea and chlamydia if:  You are sexually active and are younger than 67 years of age.  You are older than 67 years of age and your health care provider tells you that you are at risk for this type of infection.  Your sexual activity has changed since you were last screened and you are at an increased risk for chlamydia or gonorrhea. Ask your health care provider if you are at risk.  If you do not have HIV, but are at risk, it may be recommended that you take a prescription medicine daily to prevent HIV infection. This is called pre-exposure prophylaxis (PrEP). You are considered at risk if:  You are sexually active and do not regularly use condoms or know the HIV status of your partner(s).  You take drugs by injection.  You are  sexually active with a partner who has HIV. Talk with your health care provider about whether you are at high risk of being infected with HIV. If you choose to begin PrEP, you should first be tested for HIV. You should then be tested every 3 months for as long as you are taking PrEP. Pregnancy  If you are premenopausal and you may become pregnant, ask your health care provider about preconception counseling.  If you may become pregnant, take 400  to 800 micrograms (mcg) of folic acid every day.  If you want to prevent pregnancy, talk to your health care provider about birth control (contraception). Osteoporosis and menopause  Osteoporosis is a disease in which the bones lose minerals and strength with aging. This can result in serious bone fractures. Your risk for osteoporosis can be identified using a bone density scan.  If you are 51 years of age or older, or if you are at risk for osteoporosis and fractures, ask your health care provider if you should be screened.  Ask your health care provider whether you should take a calcium or vitamin D supplement to lower your risk for osteoporosis.  Menopause may have certain physical symptoms and risks.  Hormone replacement therapy may reduce some of these symptoms and risks. Talk to your health care provider about whether hormone replacement therapy is right for you. Follow these instructions at home:  Schedule regular health, dental, and eye exams.  Stay current with your immunizations.  Do not use any tobacco products including cigarettes, chewing tobacco, or electronic cigarettes.  If you are pregnant, do not drink alcohol.  If you are breastfeeding, limit how much and how often you drink alcohol.  Limit alcohol intake to no more than 1 drink per day for nonpregnant women. One drink equals 12 ounces of beer, 5 ounces of wine, or 1 ounces of hard liquor.  Do not use street drugs.  Do not share needles.  Ask your health care  provider for help if you need support or information about quitting drugs.  Tell your health care provider if you often feel depressed.  Tell your health care provider if you have ever been abused or do not feel safe at home. This information is not intended to replace advice given to you by your health care provider. Make sure you discuss any questions you have with your health care provider. Document Released: 09/04/2010 Document Revised: 07/28/2015 Document Reviewed: 11/23/2014 Elsevier Interactive Patient Education  2017 [employer].

## 2016-07-03 NOTE — Assessment & Plan Note (Signed)
Next June 2019

## 2016-07-03 NOTE — Assessment & Plan Note (Signed)
Check level and supplement if needed 

## 2016-07-03 NOTE — Assessment & Plan Note (Signed)
Due in June

## 2016-07-03 NOTE — Progress Notes (Signed)
Patient ID: Lauren Benson, female   DOB: [birth year], 66 y.o.   MRN: [id number]   Subjective:   Lauren Benson is a 66 y.o. female here for a complete physical exam  Interim issues since last visit: did have some kind of illness, cough and rode it out, got better; all gone She had trouble weaning off of HRT; cried at the drop of a hat; she does not want to be that person; tried to wean off a few years ago with help of pharmacist; she has also switched her NSAID to half of a pill every day isntead of one pill 3 days a week; we limited it because of renal function; she can't hear well out of her right ear  USPSTF grade A and B recommendations Depression:  Depression screen [facility] 2/9 07/03/2016 01/02/2016 12/13/2015 06/10/2015 08/30/2014  Decreased Interest 0 0 0 0 0  Down, Depressed, Hopeless 0 0 0 0 0  PHQ - 2 Score 0 0 0 0 0   Hypertension: BP Readings from Last 3 Encounters:  07/03/16 110/76  01/02/16 112/78  12/13/15 118/80   Obesity: Wt Readings from Last 3 Encounters:  07/03/16 141 lb 3.2 oz (64 kg)  01/02/16 140 lb (63.5 kg)  12/13/15 140 lb 4.8 oz (63.6 kg)   BMI Readings from Last 3 Encounters:  07/03/16 23.28 kg/m  01/02/16 21.93 kg/m  12/13/15 21.97 kg/m    Alcohol: not much at all, rare Tobacco use: quit 8 years; smoked  HIV, hep B, hep C: politely declined STD testing and prevention (chl/gon/syphilis): not interested Intimate partner violence: no abuse Breast cancer: UTD; yearly BRCA gene screening:  Aunt had breast cancer; no ovarian cancer Cervical cancer screening: s/p hysterectomy, abnormal periods Osteoporosis: due in June 2019 Fall prevention/vitamin D: checked vit D level and it was high at one point, taking too much for a while; stopped taking it Lipids: check Lab Results  Component Value Date   CHOL 183 07/03/2016   Lab Results  Component Value Date   HDL 74 07/03/2016   Lab Results  Component Value Date   LDLCALC 75 07/03/2016   Lab  Results  Component Value Date   TRIG 169 (H) 07/03/2016   Lab Results  Component Value Date   CHOLHDL 2.5 07/03/2016   No results found for: LDLDIRECT  Glucose: check Glucose  Date Value Ref Range Status  06/10/2015 116 (H) 65 - 99 mg/dL Final  02/18/2015 85 65 - 99 mg/dL Final   Glucose, Bld  Date Value Ref Range Status  07/03/2016 88 65 - 99 mg/dL Final  02/21/2016 164 (H) 65 - 99 mg/dL Final   Colorectal cancer: last 2012, next due 2022 Lung cancer:  n/a AAA: n/a Aspirin: taking 81 mg daily Diet: discussed, vitamin if poor diet Exercise: active, cleaning business Skin cancer: nothing worrisome  Past Medical History:  Diagnosis Date  . Fibrocystic breast disease   . GERD (gastroesophageal reflux disease)   . History of rheumatic fever   . Hypertension   . Hypothyroidism   . Lumbago   . Osteoarthritis of hand   . Osteopenia   . Osteoporosis   . Symptomatic menopausal or female climacteric states   . Thyroid condition    Past Surgical History:  Procedure Laterality Date  . BREAST LUMPECTOMY Right   . BREAST LUMPECTOMY Right   . CESAREAN SECTION     x 2  . DILATION AND CURETTAGE OF UTERUS     multiple times  .  VAGINAL HYSTERECTOMY  1991   total   Family History  Problem Relation Age of Onset  . Alzheimer's disease Mother   . Cancer Maternal Aunt     breast  . Breast cancer Maternal Aunt   . Cancer Brother     mesothelioma  . Alzheimer's disease Brother    Social History  Substance Use Topics  . Smoking status: Former Smoker    Packs/day: 0.50    Years: 12.00    Quit date: 10/03/2008  . Smokeless tobacco: Never Used     Comment: 6 pack years  . Alcohol use No   Review of Systems  Objective:   Vitals:   07/03/16 1348  BP: 110/76  Pulse: 84  Resp: 14  Temp: 98.4 F (36.9 C)  TempSrc: Oral  SpO2: 95%  Weight: 141 lb 3.2 oz (64 kg)  Height: 5' 5.3" (1.659 m)   Body mass index is 23.28 kg/m. Wt Readings from Last 3 Encounters:    07/03/16 141 lb 3.2 oz (64 kg)  01/02/16 140 lb (63.5 kg)  12/13/15 140 lb 4.8 oz (63.6 kg)   Physical Exam  Constitutional: She appears well-developed and well-nourished.  HENT:  Head: Normocephalic and atraumatic.  Right Ear: Hearing and external ear normal.  Left Ear: Hearing, tympanic membrane, external ear and ear canal normal.  Mouth/Throat: Oropharynx is clear and moist and mucous membranes are normal.  Right canal with cerumen impaction; removed with irrigation, good results  Eyes: Conjunctivae and EOM are normal. Right eye exhibits no hordeolum. Left eye exhibits no hordeolum. No scleral icterus.  Neck: Carotid bruit is not present. No thyromegaly present.  Cardiovascular: Normal rate, regular rhythm, S1 normal, S2 normal and normal heart sounds.   No extrasystoles are present.  Pulmonary/Chest: Effort normal and breath sounds normal. No respiratory distress. Right breast exhibits no inverted nipple, no mass, no nipple discharge, no skin change and no tenderness. Left breast exhibits no inverted nipple, no mass, no nipple discharge, no skin change and no tenderness. Breasts are symmetrical.    Soft swelling lateral LEFT breast at 2 to 3 o'clock position, ? Lymph node  Abdominal: Soft. Normal appearance and bowel sounds are normal. She exhibits no distension, no abdominal bruit, no pulsatile midline mass and no mass. There is no hepatosplenomegaly. There is no tenderness. No hernia.  Musculoskeletal: Normal range of motion. She exhibits no edema.  Lymphadenopathy:       Head (right side): No submandibular adenopathy present.       Head (left side): No submandibular adenopathy present.    She has no cervical adenopathy.    She has no axillary adenopathy.  Neurological: She is alert. She displays no tremor. No cranial nerve deficit. She exhibits normal muscle tone. Gait normal.  Reflex Scores:      Patellar reflexes are 2+ on the right side and 2+ on the left side. Skin: Skin is  warm and dry. No bruising and no ecchymosis noted. No cyanosis. No pallor.  Palmar erythema  Psychiatric: Her speech is normal and behavior is normal. Thought content normal. Her mood appears not anxious. She does not exhibit a depressed mood.    Assessment/Plan:   Problem List Items Addressed This Visit      Endocrine   Hypothyroidism (Chronic)    Hx of underactive thyroid; sees Dr. 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)    Check kidney function      Relevant Orders   CBC with Differential/Platelet (Completed)   COMPLETE METABOLIC PANEL WITH GFR (Completed)   Lipid panel (Completed)     Other   Vitamin D overdose    Check level and supplement if needed      Relevant Orders   VITAMIN D 25 Hydroxy (Vit-D Deficiency, Fractures) (Completed)   Preventative health care    USPSTF grade A and B recommendations reviewed with patient; age-appropriate recommendations, preventive care, screening tests, etc discussed and encouraged; healthy living encouraged; see AVS for patient education given to patient      Hormone replacement therapy (HRT)    Discussed with patient the risk of breast cancer; will always want to try to wean; she is willing to try to wean just one pill a week for a month and see how that works; I'd really like to try to decrease      Breast cancer screening    Due in June       Other Visit Diagnoses    Lump of left breast    -  Primary   Relevant Orders   MM Digital Diagnostic Bilat   US BREAST LTD UNI LEFT INC AXILLA   US BREAST LTD UNI RIGHT INC AXILLA   Impacted cerumen of right ear       Relevant Orders   Ear Lavage   Palmar erythema       Relevant Orders   B12 (Completed)       Meds ordered this encounter  Medications    . estrogens, conjugated, (PREMARIN) 0.3 MG tablet    Sig: One by mouth six days per week, then skip a day once a week    Dispense:  30 tablet    Refill:  0  . meloxicam (MOBIC) 7.5 MG tablet    Sig: Take 0.5 tablets (3.75 mg total) by mouth daily.    Dispense:  15 tablet    Refill:  2   Orders Placed This Encounter  Procedures  . MM Digital Diagnostic Bilat    Standing Status:   Future    Number of Occurrences:   1    Standing Expiration Date:   09/02/2017    Order Specific Question:   Reason for Exam (SYMPTOM  OR DIAGNOSIS REQUIRED)    Answer:   lump in left breast 1o clock position    Order Specific Question:   Preferred imaging location?    Answer:   [facility]  . US BREAST LTD UNI LEFT INC AXILLA    Standing Status:   Future    Number of Occurrences:   1    Standing Expiration Date:   09/02/2017    Order Specific Question:   Reason for Exam (SYMPTOM  OR DIAGNOSIS REQUIRED)    Answer:   lump left breast 1 o clock    Order Specific Question:   Preferred imaging location?    Answer:   [facility]  . US BREAST LTD UNI RIGHT INC AXILLA    Standing Status:   Future    Number of Occurrences:   1    Standing Expiration Date:   09/02/2017    Order Specific Question:   Reason for Exam (SYMPTOM  OR DIAGNOSIS REQUIRED)    Answer:  lump left breast 1 clock    Order Specific Question:   Preferred imaging location?    Answer:   [facility]  . B12  . CBC with Differential/Platelet  . COMPLETE METABOLIC PANEL WITH GFR  . VITAMIN D 25 Hydroxy (Vit-D Deficiency, Fractures)  . Lipid panel  . Ear Lavage    Follow up plan: Return in about 1 year (around 07/03/2017) for complete physical OR Medicare Wellness.  An After Visit Summary was printed and given to the patient.

## 2016-07-03 NOTE — Assessment & Plan Note (Signed)
USPSTF grade A and B recommendations reviewed with patient; age-appropriate recommendations, preventive care, screening tests, etc discussed and encouraged; healthy living encouraged; see AVS for patient education given to patient  

## 2016-07-04 ENCOUNTER — Encounter: Payer: Self-pay | Admitting: Family Medicine

## 2016-07-04 DIAGNOSIS — Z79890 Hormone replacement therapy (postmenopausal): Secondary | ICD-10-CM | POA: Insufficient documentation

## 2016-07-04 LAB — VITAMIN B12: Vitamin B-12: 385 pg/mL (ref 200–1100)

## 2016-07-04 LAB — VITAMIN D 25 HYDROXY (VIT D DEFICIENCY, FRACTURES): VIT D 25 HYDROXY: 48 ng/mL (ref 30–100)

## 2016-07-04 NOTE — Assessment & Plan Note (Signed)
Discussed with patient the risk of breast cancer; will always want to try to wean; she is willing to try to wean just one pill a week for a month and see how that works; I'd really like to try to decrease

## 2016-07-06 ENCOUNTER — Other Ambulatory Visit: Payer: Self-pay | Admitting: Family Medicine

## 2016-07-17 ENCOUNTER — Ambulatory Visit
Admission: RE | Admit: 2016-07-17 | Discharge: 2016-07-17 | Disposition: A | Discharge status: Home / Self Care | Payer: Managed Care, Other (non HMO) | Source: Ambulatory Visit | Attending: Family Medicine | Admitting: Family Medicine

## 2016-07-17 ENCOUNTER — Ambulatory Visit: Admission: RE | Admit: 2016-07-17 | Payer: Managed Care, Other (non HMO) | Source: Ambulatory Visit

## 2016-07-17 DIAGNOSIS — N6320 Unspecified lump in the left breast, unspecified quadrant: Secondary | ICD-10-CM | POA: Diagnosis not present

## 2016-07-17 LAB — HM MAMMOGRAPHY: HM MAMMO: NORMAL (ref 0–4)

## 2016-09-03 ENCOUNTER — Other Ambulatory Visit: Payer: Self-pay | Admitting: Family Medicine

## 2016-09-03 ENCOUNTER — Encounter: Payer: Self-pay | Admitting: Family Medicine

## 2016-09-03 DIAGNOSIS — M62830 Muscle spasm of back: Secondary | ICD-10-CM

## 2016-09-04 ENCOUNTER — Telehealth: Payer: Self-pay | Admitting: Family Medicine

## 2016-09-04 ENCOUNTER — Other Ambulatory Visit: Payer: Self-pay

## 2016-09-04 DIAGNOSIS — N182 Chronic kidney disease, stage 2 (mild): Secondary | ICD-10-CM

## 2016-09-04 NOTE — Telephone Encounter (Signed)
Patient notified not due until the beginning of august, lab order up front.

## 2016-09-04 NOTE — Telephone Encounter (Signed)
PT SAID THAT SHE HAD APPT IN MAY AND THE DR WANTED HER TO HAVE HER BLOOD WORK DONE AGAIN IN 3 MONTHS. I NEED THE ORDERS PLACED AND THEN THE PATIENT CALLED SO THAT SHE CAN HAVE THE LABS DONE.

## 2016-09-13 ENCOUNTER — Ambulatory Visit: Payer: Managed Care, Other (non HMO) | Admitting: Family Medicine

## 2016-09-17 ENCOUNTER — Telehealth: Payer: Self-pay | Admitting: Family Medicine

## 2016-09-17 NOTE — Telephone Encounter (Signed)
Requesting return call. Pt is trying to come off premarin and wanted to make sure she was doing it correctly. [phone]

## 2016-09-18 ENCOUNTER — Encounter: Payer: Self-pay | Admitting: Family Medicine

## 2016-09-18 ENCOUNTER — Ambulatory Visit: Payer: Managed Care, Other (non HMO) | Admitting: Family Medicine

## 2016-09-18 ENCOUNTER — Ambulatory Visit (INDEPENDENT_AMBULATORY_CARE_PROVIDER_SITE_OTHER): Payer: Managed Care, Other (non HMO) | Admitting: Family Medicine

## 2016-09-18 DIAGNOSIS — R252 Cramp and spasm: Secondary | ICD-10-CM | POA: Diagnosis not present

## 2016-09-18 DIAGNOSIS — F329 Major depressive disorder, single episode, unspecified: Secondary | ICD-10-CM

## 2016-09-18 DIAGNOSIS — N182 Chronic kidney disease, stage 2 (mild): Secondary | ICD-10-CM

## 2016-09-18 DIAGNOSIS — E038 Other specified hypothyroidism: Secondary | ICD-10-CM | POA: Diagnosis not present

## 2016-09-18 DIAGNOSIS — F32A Depression, unspecified: Secondary | ICD-10-CM

## 2016-09-18 MED ORDER — ESTROGENS CONJUGATED 0.3 MG PO TABS: 0.3000 mg | ORAL_TABLET | Freq: Every day | ORAL | 6 refills | Status: DC

## 2016-09-18 MED ORDER — VENLAFAXINE HCL ER 37.5 MG PO CP24: 37.5000 mg | ORAL_CAPSULE | Freq: Every day | ORAL | 0 refills | Status: DC

## 2016-09-18 NOTE — Assessment & Plan Note (Signed)
Consider K+ and Mg2+

## 2016-09-18 NOTE — Telephone Encounter (Signed)
I talked with patient She is not doing well Everything is so overwhelming She is screaming at her dogs; she has told her husband to not call her; [place] I requested that she come in today She says she is not depressed, just overwhelmed; normally goes through things and handles them She does not want to be labeled I asked again for her to come in She needs for us to come up with a different plan She finally agreed to come in at 1 pm today

## 2016-09-18 NOTE — Telephone Encounter (Signed)
I have scheduled the appointment for today at 1p

## 2016-09-18 NOTE — Patient Instructions (Signed)
Consider trying large flake nutritional yeast for B12 supplementation Consider a multiple vitamin on days lacking really good nutrition

## 2016-09-18 NOTE — Telephone Encounter (Signed)
Pt called to see if she was weaning herself off premarin right. As I was speaking to her she was crying, very emotionally and frustrated so I was trying to get what I could from her. In may she took away one pill, in June she took away another pill. What I understood she was taking one pill a month away. Looked at your notes from 07/12/2016 and I saw that you asked her to take one pill a week for a month and see how that works. After expaining to her what I read she started crying again and she stated to me she tried her best and she just can't do it anymore. I don't know if that sound more related to weaning her off the medication or personal. I was little concern when I was speaking to her. Would you please call her and speak to her.

## 2016-09-18 NOTE — Progress Notes (Signed)
BP 122/64   Pulse 94   Temp 97.9 F (36.6 C) (Oral)   Resp 14   Wt 137 lb (62.1 kg)   SpO2 96%   BMI 22.59 kg/m    Subjective:    Patient ID: Lauren Benson, female    DOB: 11-Nov-1950, 66 y.o.   MRN: [id number]  HPI: Lauren Benson is a 66 y.o. female  Chief Complaint  Patient presents with  . Depression   HPI  See phone note; she has been crying and irritable and came in for evaluation She has been trying to taper off of the estrogen Her husband made her mad; he lives in [place]; no abuse No fam hx of depression or bipolar disorder She goes to Dr. Aliene Altes for her thyroid; on 100 mcg thyroid med daily; has appt in December Last TSH Feb 13, 2016 and was normal 2.439 Last B12 was under 400; she does not take any B12 supplement right now; eating properly or not; does eat meat She has been on estrogen for 30 years, and friend says she probably just needs it Her friend got her started on Plexus vitabiome and slim products Lots of stress with her dear animals (dogs), she has large animals, lots of vet trips; expensive and she is close to them She had breast imaging Jul 17, 2016 and nothing worrisome She says medicines make her groggy Does have trouble with her sleep sometime Was having some hot flashes Her biggest thing today, she stayed in the backyard all night last and got two hours of sleep Having hot flashes Having leg cramps too; taking magnesium, has already tried tonic water  Depression screen [facility] 2/9 09/18/2016 07/03/2016 01/02/2016 12/13/2015 06/10/2015  Decreased Interest 0 0 0 0 0  Down, Depressed, Hopeless 1 0 0 0 0  PHQ - 2 Score 1 0 0 0 0    Relevant past medical, surgical, family and social history reviewed Past Medical History:  Diagnosis Date  . Fibrocystic breast disease   . GERD (gastroesophageal reflux disease)   . History of rheumatic fever   . Hypertension   . Hypothyroidism   . Lumbago   . Osteoarthritis of hand   . Osteopenia   .  Osteoporosis   . Symptomatic menopausal or female climacteric states   . Thyroid condition    Past Surgical History:  Procedure Laterality Date  . BREAST LUMPECTOMY Right   . BREAST LUMPECTOMY Right   . CESAREAN SECTION     x 2  . DILATION AND CURETTAGE OF UTERUS     multiple times  . VAGINAL HYSTERECTOMY  1991   total   Family History  Problem Relation Age of Onset  . Alzheimer's disease Mother   . Cancer Maternal Aunt        breast  . Breast cancer Maternal Aunt   . Cancer Brother        mesothelioma  . Alzheimer's disease Brother    Social History   Social History  . Marital status: Married    Spouse name: N/A  . Number of children: N/A  . Years of education: N/A   Occupational History  . Not on file.   Social History Main Topics  . Smoking status: Former Smoker    Packs/day: 0.50    Years: 12.00    Quit date: 10/03/2008  . Smokeless tobacco: Never Used     Comment: 6 pack years  . Alcohol use No  . Drug use: No  .  Sexual activity: Not Currently   Other Topics Concern  . Not on file   Social History Narrative  . No narrative on file    Interim medical history since last visit reviewed. Allergies and medications reviewed  Review of Systems Per HPI unless specifically indicated above     Objective:    BP 122/64   Pulse 94   Temp 97.9 F (36.6 C) (Oral)   Resp 14   Wt 137 lb (62.1 kg)   SpO2 96%   BMI 22.59 kg/m   Wt Readings from Last 3 Encounters:  09/18/16 137 lb (62.1 kg)  07/03/16 141 lb 3.2 oz (64 kg)  01/02/16 140 lb (63.5 kg)    Physical Exam  Constitutional: She appears well-developed and well-nourished.  HENT:  Mouth/Throat: Mucous membranes are normal.  Eyes: EOM are normal. No scleral icterus.  Cardiovascular: Normal rate.   Pulmonary/Chest: Effort normal.  Psychiatric: Her behavior is normal. Her mood appears not anxious. Her affect is not blunt. Her speech is not delayed. Thought content is not delusional. Cognition and  memory are not impaired. She does not express impulsivity. She exhibits a depressed mood. She expresses no homicidal and no suicidal ideation.  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 Sherie Don.  An after-visit summary was printed and given to the patient at check-out.  Please see the patient instructions which may contain other information and recommendations beyond what is mentioned above in the assessment and plan.  Meds ordered this encounter  Medications  . Diphenhydramine-APAP, sleep, (TYLENOL PM EXTRA STRENGTH PO)    Sig: Take by mouth at bedtime.  Marland Kitchen venlafaxine XR (EFFEXOR XR) 37.5 MG 24 hr capsule    Sig: Take 1 capsule (37.5 mg total) by mouth daily with breakfast. For one week, and then two by mouth daily    Dispense:  53 capsule    Refill:  0  . estrogens, conjugated, (PREMARIN) 0.3 MG tablet    Sig: Take 1 tablet (0.3 mg total) by mouth daily.    Dispense:  30 tablet    Refill:  6  . Cholecalciferol (VITAMIN D3) 2000 units capsule    Sig: Take 1 capsule  (2,000 Units total) by mouth 2 (two) times a week.    Orders Placed This Encounter  Procedures  . TSH  . T4, free  . Basic  Metabolic Panel (BMET)  . Magnesium

## 2016-09-18 NOTE — Assessment & Plan Note (Signed)
Check free T4 and TSH, and contact endocrinologist if needed

## 2016-09-18 NOTE — Assessment & Plan Note (Signed)
Monitor, stay hydrated

## 2016-09-20 ENCOUNTER — Telehealth: Payer: Self-pay | Admitting: Family Medicine

## 2016-09-20 NOTE — Telephone Encounter (Signed)
Yes, I had actually ordered the kidney function test to be done with these labs; we'll suggest she get them ASAP thank you

## 2016-09-20 NOTE — Telephone Encounter (Signed)
Pt.notified

## 2016-09-20 NOTE — Telephone Encounter (Signed)
Pt called stating she was supposed to have labs done this week after her appt but did not get them done. She is due to get her kidney functions done the 1st of August and is wanting to know if she can put all of these together. Please advise pt.

## 2016-09-26 LAB — TSH: TSH: 1.3 m[IU]/L

## 2016-09-26 LAB — BASIC METABOLIC PANEL
BUN: 12 mg/dL (ref 7–25)
CALCIUM: 9.2 mg/dL (ref 8.6–10.4)
CO2: 27 mmol/L (ref 20–31)
CREATININE: 1.01 mg/dL — AB (ref 0.50–0.99)
Chloride: 104 mmol/L (ref 98–110)
Glucose, Bld: 82 mg/dL (ref 65–99)
Potassium: 4.3 mmol/L (ref 3.5–5.3)
Sodium: 140 mmol/L (ref 135–146)

## 2016-09-26 LAB — MAGNESIUM: MAGNESIUM: 2.1 mg/dL (ref 1.5–2.5)

## 2016-09-26 LAB — T4, FREE: FREE T4: 1.3 ng/dL (ref 0.8–1.8)

## 2016-10-02 DIAGNOSIS — F32A Depression, unspecified: Secondary | ICD-10-CM | POA: Insufficient documentation

## 2016-10-02 DIAGNOSIS — F329 Major depressive disorder, single episode, unspecified: Secondary | ICD-10-CM | POA: Insufficient documentation

## 2016-10-02 NOTE — Assessment & Plan Note (Signed)
Supportive listening; offered counseling, increase the estrogen back up; discussed risks, benefits; effexor for symptoms as well; close f/u; call with any probolems

## 2016-10-06 ENCOUNTER — Other Ambulatory Visit: Payer: Self-pay | Admitting: Unknown Physician Specialty

## 2016-10-08 NOTE — Telephone Encounter (Signed)
Your patient.  Thanks 

## 2017-03-19 ENCOUNTER — Encounter: Payer: Self-pay | Admitting: Family Medicine

## 2017-03-19 ENCOUNTER — Ambulatory Visit (INDEPENDENT_AMBULATORY_CARE_PROVIDER_SITE_OTHER): Payer: Managed Care, Other (non HMO) | Admitting: Family Medicine

## 2017-03-19 VITALS — BP 128/78 | HR 83 | Temp 97.8°F | Ht 67.5 in | Wt 143.3 lb

## 2017-03-19 DIAGNOSIS — Z79890 Hormone replacement therapy (postmenopausal): Secondary | ICD-10-CM | POA: Diagnosis not present

## 2017-03-19 DIAGNOSIS — N951 Menopausal and female climacteric states: Secondary | ICD-10-CM

## 2017-03-19 DIAGNOSIS — Z23 Encounter for immunization: Secondary | ICD-10-CM

## 2017-03-19 DIAGNOSIS — Z7189 Other specified counseling: Secondary | ICD-10-CM | POA: Diagnosis not present

## 2017-03-19 DIAGNOSIS — Z7185 Encounter for immunization safety counseling: Secondary | ICD-10-CM

## 2017-03-19 MED ORDER — ESCITALOPRAM OXALATE 10 MG PO TABS: 10.0000 mg | ORAL_TABLET | Freq: Every day | ORAL | 0 refills | Status: DC

## 2017-03-19 NOTE — Patient Instructions (Addendum)
Try to get 1000 iu of vitamin D3 once a day or 20-30 minutes of sunshine Switch from Premarin to Estradiol Start the escitalopram 10 mg daily Call me or write a note in MyChart in about 3 weeks and just give me an update You have received the Prevnar vaccine (PCV-13) and you will not need another booster of this for the rest of your life per current ACIP guidelines In one year, you'll be due for a PPSV-23 (Pneumovax) vaccine Consider paraffin hand wax or tylenol for evening discomfort

## 2017-03-19 NOTE — Progress Notes (Signed)
BP 128/78 (BP Location: Right Arm, Patient Position: Sitting, Cuff Size: Normal)   Pulse 83   Temp 97.8 F (36.6 C) (Oral)   Ht 5' 7.5" (1.715 m)   Wt 143 lb 4.8 oz (65 kg)   SpO2 97%   BMI 22.11 kg/m    Subjective:    Patient ID: Lauren Benson, female    DOB: 1950-05-14, 67 y.o.   MRN: [id number]  HPI: Lauren Benson is a 67 y.o. female  Chief Complaint  Patient presents with  . Irritability    Pt states that she is in need of something to help control her moods and hormones, pharmacists recommends estradiol   . Immunizations    discuss PNA vaccine and TDAP  please     HPI Patient is here to dicsuss her irritability and hormones She spoke with her pharmacist about her estrogen and would like to switch to estradiol She is status post hysterectomy; she has been weaned down, taking 0.3 mg premarin now Wishes to continue, but just on another type of estrogen Pharmacist suggested estradiol and sertraline She and her husband have lived separately for their marriage, and she believes she'll be moving to [place] in June or so; she is wondering how she'll handle the transition; she has been pretty independent her She also wants to discuss the pneumonia vaccine; has not had one after turning 64 She asked about tetanus booster as well  [facility] 03/19/2017 09/18/2016 07/03/2016 01/02/2016 12/13/2015  Decreased Interest 0 0 0 0 0  Down, Depressed, Hopeless 0 1 0 0 0  PHQ - 2 Score 0 1 0 0 0    Relevant past medical, surgical, family and social history reviewed Past Medical History:  Diagnosis Date  . Fibrocystic breast disease   . GERD (gastroesophageal reflux disease)   . History of rheumatic fever   . Hypertension   . Hypothyroidism   . Lumbago   . Osteoarthritis of hand   . Osteopenia   . Osteoporosis   . Symptomatic menopausal or female climacteric states   . Thyroid condition    Past Surgical History:  Procedure Laterality Date  . BREAST  LUMPECTOMY Right   . BREAST LUMPECTOMY Right   . CESAREAN SECTION     x 2  . DILATION AND CURETTAGE OF UTERUS     multiple times  . VAGINAL HYSTERECTOMY  1991   total   Family History  Problem Relation Age of Onset  . Alzheimer's disease Mother   . Cancer Maternal Aunt        breast  . Breast cancer Maternal Aunt   . Cancer Brother        mesothelioma  . Alzheimer's disease Brother    Social History   Tobacco Use  . Smoking status: Former Smoker    Packs/day: 0.50    Years: 12.00    Pack years: 6.00    Last attempt to quit: 10/03/2008    Years since quitting: 8.4  . Smokeless tobacco: Never Used  . Tobacco comment: 6 pack years  Substance Use Topics  . Alcohol use: No  . Drug use: No    Interim medical history since last visit reviewed. Allergies and medications reviewed  Review of Systems Per HPI unless specifically indicated above     Objective:    BP 128/78 (BP Location: Right Arm, Patient Position: Sitting, Cuff Size: Normal)   Pulse 83   Temp 97.8 F (36.6 C) (Oral)  Ht 5' 7.5" (1.715 m)   Wt 143 lb 4.8 oz (65 kg)   SpO2 97%   BMI 22.11 kg/m   Wt Readings from Last 3 Encounters:  03/19/17 143 lb 4.8 oz (65 kg)  09/18/16 137 lb (62.1 kg)  07/03/16 141 lb 3.2 oz (64 kg)    Physical Exam  Constitutional: She appears well-developed and well-nourished.  HENT:  Mouth/Throat: Mucous membranes are normal.  Eyes: EOM are normal. No scleral icterus.  Cardiovascular: Normal rate and regular rhythm.  Pulmonary/Chest: Effort normal and breath sounds normal.  Psychiatric: She has a normal mood and affect. Her behavior is normal.       Assessment & Plan:   Problem List Items Addressed This Visit      Other   Symptomatic menopausal or female climacteric states    Patient wishes to continue low dose estrogen, in the form now of estradiol; with her irritability, will also use escitalopram      Hormone replacement therapy (HRT) - Primary    Patient has  been made aware of the risk of breast cancer at earlier visit; we have managed to wean her; her mammogram is UTD; will stop the premarin and use estradiol at her pharmacist's suggestion; call with any issues       Other Visit Diagnoses    Need for vaccination with 13-polyvalent pneumococcal conjugate vaccine       given today after discussion; give PPSV-23 in one year   Relevant Orders   Pneumococcal conjugate vaccine 13-valent (Completed)   Vaccine counseling       spoke with patient about the different pneumonia vaccines, as well as tetanus vaccine; medicare will pay for tetanus with injury but not for prevention       Follow up plan: No Follow-up on file.  An after-visit summary was printed and given to the patient at check-out.  Please see the patient instructions which may contain other information and recommendations beyond what is mentioned above in the assessment and plan.  Meds ordered this encounter  Medications  . escitalopram (LEXAPRO) 10 MG tablet    Sig: Take 1 tablet (10 mg total) by mouth daily.    Dispense:  30 tablet    Refill:  0  . estradiol (ESTRACE) 0.5 MG tablet    Sig: Take 1 tablet (0.5 mg total) by mouth daily.    Dispense:  30 tablet    Refill:  5    Thank you for your note!! Sorry this didn't get sent yesterday. Peace, Production designer, theatre/television/film    Orders Placed This Encounter  Procedures  . Pneumococcal conjugate vaccine 13-valent

## 2017-03-20 ENCOUNTER — Encounter: Payer: Self-pay | Admitting: Family Medicine

## 2017-03-20 MED ORDER — ESTRADIOL 0.5 MG PO TABS: 0.5000 mg | ORAL_TABLET | Freq: Every day | ORAL | 5 refills | Status: DC

## 2017-03-20 NOTE — Assessment & Plan Note (Signed)
Patient wishes to continue low dose estrogen, in the form now of estradiol; with her irritability, will also use escitalopram

## 2017-03-20 NOTE — Assessment & Plan Note (Signed)
Patient has been made aware of the risk of breast cancer at earlier visit; we have managed to wean her; her mammogram is UTD; will stop the premarin and use estradiol at her pharmacist's suggestion; call with any issues

## 2017-03-21 ENCOUNTER — Encounter: Payer: Self-pay | Admitting: Family Medicine

## 2017-03-22 ENCOUNTER — Encounter: Payer: Self-pay | Admitting: Family Medicine

## 2017-03-22 MED ORDER — SERTRALINE HCL 25 MG PO TABS: 25.0000 mg | ORAL_TABLET | Freq: Every day | ORAL | 0 refills | Status: DC

## 2017-03-31 ENCOUNTER — Encounter: Payer: Self-pay | Admitting: Family Medicine

## 2017-04-01 ENCOUNTER — Encounter: Payer: Self-pay | Admitting: Family Medicine

## 2017-04-17 ENCOUNTER — Encounter: Payer: Self-pay | Admitting: Family Medicine

## 2017-04-21 NOTE — Progress Notes (Signed)
Closing out lab/order note open since:  July 2018 

## 2017-05-03 ENCOUNTER — Other Ambulatory Visit: Payer: Self-pay | Admitting: Family Medicine

## 2017-05-23 ENCOUNTER — Other Ambulatory Visit: Payer: Self-pay | Admitting: Family Medicine

## 2017-05-23 NOTE — Telephone Encounter (Signed)
Request for estradiol received I prescribed a 6 month supply on 1/1/62019 Denied Too soon for refills Outpatient Medication Detail    Disp Refills Start End   estradiol (ESTRACE) 0.5 MG tablet 30 tablet 5 03/20/2017    Sig - Route: Take 1 tablet (0.5 mg total) by mouth daily. - Oral   Sent to pharmacy as: estradiol (ESTRACE) 0.5 MG tablet   Notes to Pharmacy: Thank you for your note!! Sorry this didn't get sent yesterday. Peace, [place]   E-Prescribing Status: Receipt confirmed by pharmacy (03/20/2017 2:09 PM EST)

## 2017-06-10 ENCOUNTER — Other Ambulatory Visit: Payer: Self-pay | Admitting: Family Medicine

## 2017-06-10 DIAGNOSIS — Z1231 Encounter for screening mammogram for malignant neoplasm of breast: Secondary | ICD-10-CM

## 2017-07-04 ENCOUNTER — Encounter: Payer: Managed Care, Other (non HMO) | Admitting: Family Medicine

## 2017-07-05 ENCOUNTER — Telehealth: Payer: Self-pay | Admitting: Family Medicine

## 2017-07-05 ENCOUNTER — Ambulatory Visit (INDEPENDENT_AMBULATORY_CARE_PROVIDER_SITE_OTHER): Payer: Managed Care, Other (non HMO) | Admitting: Nurse Practitioner

## 2017-07-05 ENCOUNTER — Encounter: Payer: Self-pay | Admitting: Nurse Practitioner

## 2017-07-05 VITALS — BP 130/78 | HR 87 | Temp 98.2°F | Resp 14 | Ht 68.0 in | Wt 143.9 lb

## 2017-07-05 DIAGNOSIS — Z0000 Encounter for general adult medical examination without abnormal findings: Secondary | ICD-10-CM | POA: Diagnosis not present

## 2017-07-05 DIAGNOSIS — L608 Other nail disorders: Secondary | ICD-10-CM | POA: Diagnosis not present

## 2017-07-05 DIAGNOSIS — D692 Other nonthrombocytopenic purpura: Secondary | ICD-10-CM | POA: Diagnosis not present

## 2017-07-05 DIAGNOSIS — S61412A Laceration without foreign body of left hand, initial encounter: Secondary | ICD-10-CM

## 2017-07-05 DIAGNOSIS — N182 Chronic kidney disease, stage 2 (mild): Secondary | ICD-10-CM

## 2017-07-05 DIAGNOSIS — E039 Hypothyroidism, unspecified: Secondary | ICD-10-CM | POA: Diagnosis not present

## 2017-07-05 DIAGNOSIS — E559 Vitamin D deficiency, unspecified: Secondary | ICD-10-CM

## 2017-07-05 LAB — COMPLETE METABOLIC PANEL WITH GFR
AG Ratio: 1.8 (calc) (ref 1.0–2.5)
ALBUMIN MSPROF: 4.3 g/dL (ref 3.6–5.1)
ALT: 14 U/L (ref 6–29)
AST: 20 U/L (ref 10–35)
Alkaline phosphatase (APISO): 58 U/L (ref 33–130)
BUN: 15 mg/dL (ref 7–25)
CHLORIDE: 103 mmol/L (ref 98–110)
CO2: 33 mmol/L — ABNORMAL HIGH (ref 20–32)
Calcium: 9.7 mg/dL (ref 8.6–10.4)
Creat: 0.96 mg/dL (ref 0.50–0.99)
GFR, Est African American: 71 mL/min/{1.73_m2} (ref >=60)
GFR, Est Non African American: 61 mL/min/{1.73_m2} (ref >=60)
GLUCOSE: 92 mg/dL (ref 65–99)
Globulin: 2.4 g/dL (calc) (ref 1.9–3.7)
Potassium: 4.5 mmol/L (ref 3.5–5.3)
Sodium: 142 mmol/L (ref 135–146)
Total Bilirubin: 0.5 mg/dL (ref 0.2–1.2)
Total Protein: 6.7 g/dL (ref 6.1–8.1)

## 2017-07-05 LAB — TSH: TSH: 1.75 m[IU]/L (ref 0.40–4.50)

## 2017-07-05 NOTE — Patient Instructions (Addendum)
It was great meeting you today and I wish you the best of luck in your new adventures in [place]. Here's some of what we discussed today: - Keep the great work with a healthy diet and water intake and activity; recommendation is for 150 minutes of physical activity a week, 2 servings of fish, one serving of tree nuts every other day and at least 8 glasses of water daily.  - Continue multivitamin and daily medicines -Discuss advanced care planning when ready with your loved ones - we are doing lab work If you have not heard anything from my staff in a week about any orders/referrals/studies from today, please contact us here to follow-up [phone] - I believe you have a fungal infection of your nail and would like to refer you to dermatology to prevent further damage to your nail bed. Because they typically require use of oral anti-fungals for a significant time I want to confirm this diagnosis before treating which is why I would like to send you to a specialist. If you change your mind and decide to see someone let me know and I will put a referral in.  -mammogram 5/20   Kegel Exercises Kegel exercises help strengthen the muscles that support the rectum, vagina, small intestine, bladder, and uterus. Doing Kegel exercises can help:  Improve bladder and bowel control.  Improve sexual response.  Reduce problems and discomfort during pregnancy.  Kegel exercises involve squeezing your pelvic floor muscles, which are the same muscles you squeeze when you try to stop the flow of urine. The exercises can be done while sitting, standing, or lying down, but it is best to vary your position. Phase 1 exercises 1. Squeeze your pelvic floor muscles tight. You should feel a tight lift in your rectal area. If you are a female, you should also feel a tightness in your vaginal area. Keep your stomach, buttocks, and legs relaxed. 2. Hold the muscles tight for up to 10 seconds. 3. Relax your muscles. Repeat  this exercise 50 times a day or as many times as told by your health care provider. Continue to do this exercise for at least 4-6 weeks or for as long as told by your health care provider. This information is not intended to replace advice given to you by your health care provider. Make sure you discuss any questions you have with your health care provider. Document Released: 02/06/2012 Document Revised: 10/15/2015 Document Reviewed: 01/09/2015 Elsevier Interactive Patient Education  2018 [employer]    Fungal Nail Infection Fungal nail infection is a common fungal infection of the toenails or fingernails. This condition affects toenails more often than fingernails. More than one nail may be infected. The condition can be passed from person to person (is contagious). What are the causes? This condition is caused by a fungus. Several types of funguses can cause the infection. These funguses are common in moist and warm areas. If your hands or feet come into contact with the fungus, it may get into a crack in your fingernail or toenail and cause the infection. What increases the risk? The following factors may make you more likely to develop this condition:  Being female.  Having diabetes.  Being of older age.  Living with someone who has the fungus.  Walking barefoot in areas where the fungus thrives, such as showers or locker rooms.  Having poor circulation.  Wearing shoes and socks that cause your feet to sweat.  Having athlete's foot.  Having a  nail injury or history of a recent nail surgery.  Having psoriasis.  Having a weak body defense system (immune system).  What are the signs or symptoms? Symptoms of this condition include:  A pale spot on the nail.  Thickening of the nail.  A nail that becomes yellow or brown.  A brittle or ragged nail edge.  A crumbling nail.  A nail that has lifted away from the nail bed.  How is this diagnosed? This condition is  diagnosed with a physical exam. Your health care provider may take a scraping or clipping from your nail to test for the fungus. How is this treated? Mild infections do not need treatment. If you have significant nail changes, treatment may include:  Oral antifungal medicines. You may need to take the medicine for several weeks or several months, and you may not see the results for a long time. These medicines can cause side effects. Ask your health care provider what problems to watch for.  Antifungal nail polish and nail cream. These may be used along with oral antifungal medicines.  Laser treatment of the nail.  Surgery to remove the nail. This may be needed for the most severe infections.  Treatment takes a long time, and the infection may come back. Follow these instructions at home: Medicines  Take or apply over-the-counter and prescription medicines only as told by your health care provider.  Ask your health care provider about using over-the-counter mentholated ointment on your nails. Lifestyle   Do not share personal items, such as towels or nail clippers.  Trim your nails often.  Wash and dry your hands and feet every day.  Wear absorbent socks, and change your socks frequently.  Wear shoes that allow air to circulate, such as sandals or canvas tennis shoes. Throw out old shoes.  Wear rubber gloves if you are working with your hands in wet areas.  Do not walk barefoot in shower rooms or locker rooms.  Do not use a nail salon that does not use clean instruments.  Do not use artificial nails. General instructions  Keep all follow-up visits as told by your health care provider. This is important.  Use antifungal foot powder on your feet and in your shoes. Contact a health care provider if: Your infection is not getting better or it is getting worse after several months. This information is not intended to replace advice given to you by your health care provider.  Make sure you discuss any questions you have with your health care provider. Document Released: 02/17/2000 Document Revised: 07/28/2015 Document Reviewed: 08/23/2014 Elsevier Interactive Patient Education  2018 [employer].

## 2017-07-05 NOTE — Telephone Encounter (Signed)
Contacted patient and informed that she was not charged for tetanus vaccine on today.

## 2017-07-05 NOTE — Progress Notes (Signed)
Name: Lauren Benson   MRN: [id number]    DOB: February 24, 1951   Date:07/05/2017       Progress Note  Subjective  Chief Complaint  Chief Complaint  Patient presents with  . Annual Exam    HPI  Patient presents for annual CPE.  Diet: eats breakfast- oatmeal or cereal, eats late lunch- salad, chicken, fish- salmon. Does snack sometimes- tangerine, apples. States eats when she is hungry but doesn't have to eat a lot. Drinks mostly water, coffee in the morning, sweet tea occasionally.  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 Doesn't exercise outside of that.   USPSTF grade A and B recommendations  Depression:  Depression screen [facility] 2/9 07/05/2017 03/19/2017 09/18/2016 07/03/2016 01/02/2016  Decreased Interest 0 0 0 0 0  Down, Depressed, Hopeless 0 0 1 0 0  PHQ - 2 Score 0 0 1 0 0   Hypertension: BP Readings from Last 3 Encounters:  07/05/17 130/78  03/19/17 128/78  09/18/16 122/64   Obesity: Wt Readings from Last 3 Encounters:  07/05/17 143 lb 14.4 oz (65.3 kg)  03/19/17 143 lb 4.8 oz (65 kg)  09/18/16 137 lb (62.1 kg)   BMI Readings from Last 3 Encounters:  07/05/17 21.88 kg/m  03/19/17 22.11 kg/m  09/18/16 22.59 kg/m    Alcohol: does not drink  Tobacco use: doesn't use HIV, hep B, hep C: decline  STD testing and prevention (chl/gon/syphilis): declined.  Intimate partner violence: denies  Sexual History/Pain during Intercourse: married, not sexually active.  Menstrual History/LMP/Abnormal Bleeding: hysterectomy- no vaginal bleeding Incontinence Symptoms: no incontinence symptoms   Advanced Care Planning: A voluntary discussion about advance care planning including the explanation and discussion of advance directives.  Discussed health care proxy and Living will, and the patient was unable to identify a health care proxy.  Patient does not know have a living will at present time. If patient does have living will, I have  requested they bring this to the clinic to be scanned in to their chart. States she will let us know   Breast cancer:  HM Mammogram  Date Value Ref Range Status  07/17/2016 Self Reported Normal 0-4 Bi-Rad, Self Reported Normal Final    Due for annual screening- scheduled 5/20  Cervical cancer screening: hysterectomy  Osteoporosis: DEXA scan Completed in 2017  Fall prevention/vitamin D: states tums every day with vitamin and vitamin D OTC twice weekly. Lipids:  Lab Results  Component Value Date   CHOL 183 07/03/2016   Lab Results  Component Value Date   HDL 74 07/03/2016   Lab Results  Component Value Date   LDLCALC 75 07/03/2016   Lab Results  Component Value Date   TRIG 169 (H) 07/03/2016   Lab Results  Component Value Date   CHOLHDL 2.5 07/03/2016   No results found for: LDLDIRECT  Glucose:  Glucose, Bld  Date Value Ref Range Status  09/26/2016 82 65 - 99 mg/dL Final  40/98/1191 88 65 - 99 mg/dL Final  47/82/9562 130 (H) 65 - 99 mg/dL Final    Skin cancer: discussed prevention, and s&s skin cancer Colorectal cancer: up to date  Aspirin: takes daily aspirin   Patient Active Problem List   Diagnosis Date Noted  . Depression 10/02/2016  . Leg cramps 09/18/2016  . Hormone replacement therapy (HRT) 07/04/2016  . Vitamin D overdose 07/03/2016  . Fatigue 08/12/2015  . Breast cancer screening 08/11/2015  . Right hip pain 08/07/2015  .  Dermatitis 06/10/2015  . CKD (chronic kidney disease) stage 2, GFR 60-89 ml/min 06/10/2015  . Preventative health care 08/30/2014  . Osteopenia   . Hypertension   . Hypothyroidism   . Osteoarthritis of hand   . GERD (gastroesophageal reflux disease)   . Symptomatic menopausal or female climacteric states   . Lumbago   . Vitamin D deficiency, unspecified 10/26/2013    Past Surgical History:  Procedure Laterality Date  . BREAST LUMPECTOMY Right   . BREAST LUMPECTOMY Right   . CESAREAN SECTION     x 2  . DILATION AND  CURETTAGE OF UTERUS     multiple times  . VAGINAL HYSTERECTOMY  1991   total    Family History  Problem Relation Age of Onset  . Alzheimer's disease Mother   . Cancer Maternal Aunt        breast  . Breast cancer Maternal Aunt   . Cancer Brother        mesothelioma  . Alzheimer's disease Brother     Social History   Socioeconomic History  . Marital status: Married    Spouse name: Not on file  . Number of children: Not on file  . Years of education: Not on file  . Highest education level: Not on file  Occupational History  . Not on file  Social Needs  . Financial resource strain: Not on file  . Food insecurity:    Worry: Not on file    Inability: Not on file  . Transportation needs:    Medical: Not on file    Non-medical: Not on file  Tobacco Use  . Smoking status: Former Smoker    Packs/day: 0.50    Years: 12.00    Pack years: 6.00    Last attempt to quit: 10/03/2008    Years since quitting: 8.7  . Smokeless tobacco: Never Used  . Tobacco comment: 6 pack years  Substance and Sexual Activity  . Alcohol use: No  . Drug use: No  . Sexual activity: Not Currently  Lifestyle  . Physical activity:    Days per week: Not on file    Minutes per session: Not on file  . Stress: Not on file  Relationships  . Social connections:    Talks on phone: Not on file    Gets together: Not on file    Attends religious service: Not on file    Active member of club or organization: Not on file    Attends meetings of clubs or organizations: Not on file    Relationship status: Not on file  . Intimate partner violence:    Fear of current or ex partner: Not on file    Emotionally abused: Not on file    Physically abused: Not on file    Forced sexual activity: Not on file  Other Topics Concern  . Not on file  Social History Narrative  . Not on file     Current Outpatient Medications:  .  aspirin 81 MG chewable tablet, Chew 81 mg by mouth daily., Disp: , Rfl:  .  calcium  carbonate (TUMS - DOSED IN MG ELEMENTAL CALCIUM) 500 MG chewable tablet, Chew 1 tablet by mouth daily., Disp: , Rfl:  .  Diphenhydramine-APAP, sleep, (TYLENOL PM EXTRA STRENGTH PO), Take by mouth at bedtime., Disp: , Rfl:  .  estradiol (ESTRACE) 0.5 MG tablet, Take 1 tablet (0.5 mg total) by mouth daily., Disp: 30 tablet, Rfl: 5 .  gabapentin (NEURONTIN) 400 MG  capsule, TAKE 1 CAPSULE BY MOUTH AT BEDTIME, Disp: 30 capsule, Rfl: 5 .  Multiple Vitamins-Minerals (HM MULTIVITAMIN ADULT GUMMY PO), Take by mouth daily., Disp: , Rfl:  .  SYNTHROID 100 MCG tablet, Take 100 mcg by mouth daily before breakfast. , Disp: , Rfl:  .  tiZANidine (ZANAFLEX) 2 MG tablet, TAKE 1 TABLET BY MOUTH 3 TIMES DAILY AS NEEDED FOR MUSCLE SPASMS, Disp: 30 tablet, Rfl: 3 .  sertraline (ZOLOFT) 25 MG tablet, Take 1 tablet (25 mg total) by mouth daily. (Patient not taking: Reported on 07/05/2017), Disp: 30 tablet, Rfl: 0  Allergies  Allergen Reactions  . Codeine Nausea Only     ROS  Constitutional: Negative for fever or weight change.  Respiratory: Negative for cough and shortness of breath.   Cardiovascular: Negative for chest pain or palpitations.  Gastrointestinal: Negative for abdominal pain, no bowel changes.  Musculoskeletal: Negative for gait problem or joint swelling.  Skin: Negative for rash.  Neurological: Negative for dizziness or headache.  No other specific complaints in a complete review of systems (except as listed in HPI above).   Objective  Vitals:   07/05/17 1331  BP: 130/78  Pulse: 87  Resp: 14  Temp: 98.2 F (36.8 C)  TempSrc: Oral  SpO2: 93%  Weight: 143 lb 14.4 oz (65.3 kg)  Height:  (1.727 m)    Body mass index is 21.88 kg/m.  Physical Exam  Constitutional: She appears well-developed and well-nourished.  Skin:       Constitutional: Patient appears well-developed and well-nourished. No distress.  HENT: Head: Normocephalic and atraumatic. Ears: B TMs ok, no erythema or  effusion; Nose: Nose normal. Mouth/Throat: Oropharynx is clear and moist. No oropharyngeal exudate.  Eyes: Conjunctivae and EOM are normal. Pupils are equal, round, and reactive to light. No scleral icterus.  Neck: Normal range of motion. Neck supple. No JVD present. No thyromegaly present.  Cardiovascular: Normal rate, regular rhythm and normal heart sounds.  No murmur heard. No BLE edema. Pulmonary/Chest: Effort normal and breath sounds normal. No respiratory distress. Abdominal: Soft. Bowel sounds are normal, no distension. There is no tenderness. no masses FEMALE GENITALIA: deferred pt preference  RECTAL: deferred pt preference  Musculoskeletal: Normal range of motion, no joint effusions. No gross deformities Neurological: he is alert and oriented to person, place, and time. No cranial nerve deficit. Coordination, balance, strength, speech and gait are normal.  Skin: Skin is warm and dry. No rash noted. No erythema. First digit of left hand nail tip discolored and elevated.  Psychiatric: Patient has a normal mood and affect. behavior is normal. Judgment and thought content normal.  No results found for this or any previous visit (from the past 2160 hour(s)).   PHQ2/9: Depression screen [facility] 2/9 07/05/2017 03/19/2017 09/18/2016 07/03/2016 01/02/2016  Decreased Interest 0 0 0 0 0  Down, Depressed, Hopeless 0 0 1 0 0  PHQ - 2 Score 0 0 1 0 0    Fall Risk: Fall Risk  07/05/2017 03/19/2017 09/18/2016 07/03/2016 01/02/2016  Falls in the past year? No No No No No    Functional Status Survey: Is the patient deaf or have difficulty hearing?: No Does the patient have difficulty seeing, even when wearing glasses/contacts?: No Does the patient have difficulty concentrating, remembering, or making decisions?: No Does the patient have difficulty walking or climbing stairs?: No Does the patient have difficulty dressing or bathing?: No Does the patient have difficulty doing errands alone such as visiting a  doctor's  office or shopping?: No  Assessment & Plan  1. Routine general medical examination at a health care facility Overall in good health, moving to [place] this summer.  - Continue multivitamin and daily medicines -Discuss advanced care planning when ready with your loved ones - kegels - I believe you have a fungal infection of your nail and would like to refer you to dermatology to prevent further damage to your nail bed. Because they typically require use of oral anti-fungals for a significant time I want to confirm this diagnosis before treating which is why I would like to send you to a specialist. If you change your mind and decide to see someone let me know and I will put a referral in.  -mammogram 5/20   2. Hypothyroidism, unspecified type Stable, cont dose until instructed otherwise  - TSH  3. CKD (chronic kidney disease) stage 2, GFR 60-89 ml/min -avoid nsaids, drink plenty of water  - COMPLETE METABOLIC PANEL WITH GFR  4. Vitamin D deficiency, unspecified Continue current regimen  - Vitamin D (25 hydroxy)  5. Laceration of left hand, foreign body presence unspecified, initial encounter - keep clean and dry  - Tdap vaccine greater than or equal to 7yo IM  6. Senile purpura (HCC) Daily aspirin   7. Nail deformity - want to refer to derm pt declined, concern for onychomycosis, will refer if pt desires later.   -USPSTF grade A and B recommendations reviewed with patient; age-appropriate recommendations, preventive care, screening tests, etc discussed and encouraged; healthy living encouraged; see AVS for patient education given to patient -Discussed importance of 150 minutes of physical activity weekly, eat two servings of fish weekly, eat one serving of tree nuts ( cashews, pistachios, pecans, almonds.Marland Kitchen) every other day, eat 6 servings of fruit/vegetables daily and drink plenty of water and avoid sweet beverages.  -Red flags and when to present for emergency care or RTC  including fever >101.54F, chest pain, shortness of breath, new/worsening/un-resolving symptoms  reviewed with patient at time of visit. Follow up and care instructions discussed and provided in AVS. -Reviewed Health Maintenance:

## 2017-07-05 NOTE — Telephone Encounter (Signed)
Copied from CRM [id number]. Topic: Quick Communication - See Telephone Encounter >> Jul 05, 2017  3:11 PM Raquel Sarna wrote: Pt didn't get her tetanus shot today.  She was thinking she was supposed to.  Pt didn't want to be charged for it.

## 2017-07-06 LAB — VITAMIN D 25 HYDROXY (VIT D DEFICIENCY, FRACTURES): VIT D 25 HYDROXY: 40 ng/mL (ref 30–100)

## 2017-07-08 ENCOUNTER — Encounter: Payer: Self-pay | Admitting: Family Medicine

## 2017-07-22 ENCOUNTER — Ambulatory Visit
Admission: RE | Admit: 2017-07-22 | Discharge: 2017-07-22 | Disposition: A | Discharge status: Home / Self Care | Payer: Managed Care, Other (non HMO) | Source: Ambulatory Visit | Attending: Family Medicine | Admitting: Family Medicine

## 2017-07-22 DIAGNOSIS — Z1231 Encounter for screening mammogram for malignant neoplasm of breast: Secondary | ICD-10-CM | POA: Insufficient documentation

## 2017-07-27 ENCOUNTER — Encounter: Payer: Self-pay | Admitting: Family Medicine

## 2017-07-30 ENCOUNTER — Encounter: Payer: Self-pay | Admitting: Family Medicine

## 2017-07-30 MED ORDER — BUSPIRONE HCL 10 MG PO TABS: 10.0000 mg | ORAL_TABLET | Freq: Two times a day (BID) | ORAL | 0 refills | Status: DC | PRN

## 2017-09-30 ENCOUNTER — Other Ambulatory Visit: Payer: Self-pay | Admitting: Family Medicine

## 2017-10-21 ENCOUNTER — Telehealth: Payer: Self-pay

## 2017-10-21 MED ORDER — GABAPENTIN 100 MG PO CAPS: 100.0000 mg | ORAL_CAPSULE | Freq: Two times a day (BID) | ORAL | 0 refills | Status: DC

## 2017-10-21 NOTE — Telephone Encounter (Signed)
Copied from CRM [phone]. Topic: General - Other >> Oct 21, 2017 12:01 PM Mcneil, Ja-Kwan wrote: Reason for CRM: Pt states she tweaked her back moving and she would like Dr. Sherie DonLada nurse to call her back to discuss possibly having a Rx for gabapentin (NEURONTIN) 100 MG . Pt request call back

## 2017-10-21 NOTE — Telephone Encounter (Signed)
She says it is feeling better somewhat She was wondering if she could take a lower dose of gabapentin during the day

## 2017-10-29 ENCOUNTER — Other Ambulatory Visit: Payer: Self-pay | Admitting: Family Medicine

## 2017-11-11 ENCOUNTER — Ambulatory Visit: Payer: Managed Care, Other (non HMO) | Admitting: Family Medicine

## 2017-11-15 ENCOUNTER — Encounter: Payer: Self-pay | Admitting: Family Medicine

## 2017-11-15 ENCOUNTER — Ambulatory Visit (INDEPENDENT_AMBULATORY_CARE_PROVIDER_SITE_OTHER): Payer: Managed Care, Other (non HMO) | Admitting: Family Medicine

## 2017-11-15 VITALS — BP 130/82 | HR 79 | Temp 97.9°F | Ht 68.0 in | Wt 145.3 lb

## 2017-11-15 DIAGNOSIS — E039 Hypothyroidism, unspecified: Secondary | ICD-10-CM

## 2017-11-15 DIAGNOSIS — I10 Essential (primary) hypertension: Secondary | ICD-10-CM

## 2017-11-15 DIAGNOSIS — M545 Low back pain, unspecified: Secondary | ICD-10-CM

## 2017-11-15 DIAGNOSIS — G8929 Other chronic pain: Secondary | ICD-10-CM

## 2017-11-15 DIAGNOSIS — F419 Anxiety disorder, unspecified: Secondary | ICD-10-CM

## 2017-11-15 DIAGNOSIS — Z79890 Hormone replacement therapy (postmenopausal): Secondary | ICD-10-CM | POA: Diagnosis not present

## 2017-11-15 DIAGNOSIS — E559 Vitamin D deficiency, unspecified: Secondary | ICD-10-CM | POA: Diagnosis not present

## 2017-11-15 DIAGNOSIS — M8580 Other specified disorders of bone density and structure, unspecified site: Secondary | ICD-10-CM

## 2017-11-15 DIAGNOSIS — Z23 Encounter for immunization: Secondary | ICD-10-CM

## 2017-11-15 DIAGNOSIS — D692 Other nonthrombocytopenic purpura: Secondary | ICD-10-CM

## 2017-11-15 DIAGNOSIS — N182 Chronic kidney disease, stage 2 (mild): Secondary | ICD-10-CM

## 2017-11-15 LAB — BASIC METABOLIC PANEL
BUN/Creatinine Ratio: 14 (calc) (ref 6–22)
BUN: 15 mg/dL (ref 7–25)
CHLORIDE: 104 mmol/L (ref 98–110)
CO2: 30 mmol/L (ref 20–32)
Calcium: 9.2 mg/dL (ref 8.6–10.4)
Creat: 1.05 mg/dL — ABNORMAL HIGH (ref 0.50–0.99)
Glucose, Bld: 92 mg/dL (ref 65–139)
POTASSIUM: 3.8 mmol/L (ref 3.5–5.3)
SODIUM: 142 mmol/L (ref 135–146)

## 2017-11-15 MED ORDER — GABAPENTIN 400 MG PO CAPS: 400.0000 mg | ORAL_CAPSULE | Freq: Every day | ORAL | 3 refills | Status: AC

## 2017-11-15 MED ORDER — ESTRADIOL 0.5 MG PO TABS: 0.5000 mg | ORAL_TABLET | Freq: Every day | ORAL | 2 refills | Status: AC

## 2017-11-15 NOTE — Patient Instructions (Signed)
Vitamin B12 250 or 500 or 1000 mcg, one or two days week Please do call to schedule your bone density study; the number to schedule [facility] at either [facility] or [facility] is [phone] or [phone]

## 2017-11-15 NOTE — Progress Notes (Signed)
BP 130/82   Pulse 79   Temp 97.9 F (36.6 C)   Ht 5\' 8"  (1.727 m)   Wt 145 lb 4.8 oz (65.9 kg)   SpO2 96%   BMI 22.09 kg/m    Subjective:    Patient ID: Lauren Benson, female    DOB: 1950-06-05, 67 y.o.   MRN: [id number]  HPI: Lauren FetchSara Rebecca Polo is a 67 y.o. female  Chief Complaint  Patient presents with  . Medication Refill    HPI Patient is here for f/u  Anxiety Had tried buspar and zoloft in the past but it made her too drowsy, took half tab and it still made her drowsy. Feels she can just work through her anxieties. Feels it is at a workable level- just had some situational anxiety about moving.   Lumbago Takes 400mg  of gabapentin every night, and a 100mg  during the day if needed; states hasn't taken it yet. Back got better so never had to take the 100 mg during the day, but has it if needed  Hot Flashes Was on premarin then switched estradiol- takes it every day; hasn't had hot flashes in year. States had been on estrogen therapy since hysterectomy due to vaginal bleeding. discused and pt wishes to continue  Hypothyroidism Has seen Dr. Turner Daniels'Connel; endocrinologist- given year supply last week; 100mcg 6 days a week and half pill one day a week. States has been on this dosage for the past 4 years, endocrine states can be followed by PCP now. TSH was 0.993 on Sept 11, 2019  Osteopenia Dexa scan in 2017; takes calcium supplements. States Vitamin D level was too high in the past.   Vitamin B12 def Not eating a lot of meat  She does bruise easily  [facility] 2/9 11/15/2017 07/05/2017 03/19/2017 09/18/2016 07/03/2016  Decreased Interest 0 0 0 0 0  Down, Depressed, Hopeless 0 0 0 1 0  PHQ - 2 Score 0 0 0 1 0  Altered sleeping 0 - - - -  Tired, decreased energy 0 - - - -  Change in appetite 0 - - - -  Feeling bad or failure about yourself  0 - - - -  Trouble concentrating 0 - - - -  Moving slowly or fidgety/restless 0 - - - -  Suicidal thoughts 0 - - - -    PHQ-9 Score 0 - - - -    Relevant past medical, surgical, family and social history reviewed Past Medical History:  Diagnosis Date  . Fibrocystic breast disease   . GERD (gastroesophageal reflux disease)   . History of rheumatic fever   . Hypertension   . Hypothyroidism   . Lumbago   . Osteoarthritis of hand   . Osteopenia   . Osteoporosis   . Symptomatic menopausal or female climacteric states   . Thyroid condition    Past Surgical History:  Procedure Laterality Date  . BREAST EXCISIONAL BIOPSY Right    benign  . CESAREAN SECTION     x 2  . DILATION AND CURETTAGE OF UTERUS     multiple times  . VAGINAL HYSTERECTOMY  1991   total   Family History  Problem Relation Age of Onset  . Alzheimer's disease Mother   . Cancer Maternal Aunt        breast  . Breast cancer Maternal Aunt   . Cancer Brother        mesothelioma  . Alzheimer's disease Brother  Social History   Tobacco Use  . Smoking status: Former Smoker    Packs/day: 0.50    Years: 12.00    Pack years: 6.00    Last attempt to quit: 10/03/2008    Years since quitting: 9.1  . Smokeless tobacco: Never Used  . Tobacco comment: 6 pack years  Substance Use Topics  . Alcohol use: No  . Drug use: No    Interim medical history since last visit reviewed. Allergies and medications reviewed  Review of Systems Per HPI unless specifically indicated above     Objective:    BP 130/82   Pulse 79   Temp 97.9 F (36.6 C)   Ht 5\' 8"  (1.727 m)   Wt 145 lb 4.8 oz (65.9 kg)   SpO2 96%   BMI 22.09 kg/m   Wt Readings from Last 3 Encounters:  11/15/17 145 lb 4.8 oz (65.9 kg)  07/05/17 143 lb 14.4 oz (65.3 kg)  03/19/17 143 lb 4.8 oz (65 kg)    Physical Exam  Constitutional: She appears well-developed and well-nourished.  HENT:  Mouth/Throat: Mucous membranes are normal.  Eyes: EOM are normal. No scleral icterus.  Cardiovascular: Normal rate and regular rhythm.  Pulmonary/Chest: Effort normal and breath  sounds normal.  Psychiatric: She has a normal mood and affect. Her behavior is normal.      Assessment & Plan:   Problem List Items Addressed This Visit      Cardiovascular and Mediastinum   Hypertension (Chronic)    Controlld; maintain healthy weight, limit NSAIDs; follow DASH guidelines        Endocrine   Hypothyroidism - Primary (Chronic)    Will be glad to manage going forward; turned loose back to PCP by endo        Musculoskeletal and Integument   Osteopenia    Suggested DEXA scan      Relevant Orders   DG Bone Density     Genitourinary   CKD (chronic kidney disease) stage 2, GFR 60-89 ml/min (Chronic)    Limit NSAIDs as much as possible; for patient, quality of life important so she will use least amount of NSAID to help with pain and functionality      Relevant Orders   Basic metabolic panel (Completed)     Other   Vitamin D deficiency, unspecified   Lumbago    Using gabapentin for pain rather than nightly NSAIDs (because of CKD)      Hormone replacement therapy (HRT)    Discussed risk of cancer with HRT: patient does not wish to come off; will continue to address with her periodically; explained we hope to use this the least amoutn of time       Other Visit Diagnoses    Senile purpura (HCC)       Need for influenza vaccination       Relevant Orders   Flu vaccine HIGH DOSE PF (Fluzone High dose) (Completed)   Anxiety           Follow up plan: Return if symptoms worsen or fail to improve.  An after-visit summary was printed and given to the patient at check-out.  Please see the patient instructions which may contain other information and recommendations beyond what is mentioned above in the assessment and plan.  Meds ordered this encounter  Medications  . estradiol (ESTRACE) 0.5 MG tablet    Sig: Take 1 tablet (0.5 mg total) by mouth daily.    Dispense:  90 tablet  Refill:  2    Thank you for your note!! Sorry this didn't get sent yesterday.  [place],   . gabapentin (NEURONTIN) 400 MG capsule    Sig: Take 1 capsule (400 mg total) by mouth at bedtime.    Dispense:  90 capsule    Refill:  3    Orders Placed This Encounter  Procedures  . DG Bone Density  . Flu vaccine HIGH DOSE PF (Fluzone High dose)  . Basic metabolic panel

## 2017-11-18 ENCOUNTER — Encounter: Payer: Self-pay | Admitting: Family Medicine

## 2017-11-25 NOTE — Assessment & Plan Note (Signed)
Using gabapentin for pain rather than nightly NSAIDs (because of CKD)

## 2017-11-25 NOTE — Assessment & Plan Note (Signed)
Will be glad to manage going forward; turned loose back to PCP by endo

## 2017-11-25 NOTE — Assessment & Plan Note (Signed)
Suggested DEXA scan

## 2017-11-25 NOTE — Assessment & Plan Note (Signed)
Discussed risk of cancer with HRT: patient does not wish to come off; will continue to address with her periodically; explained we hope to use this the least amoutn of time

## 2017-11-25 NOTE — Assessment & Plan Note (Signed)
Controlld; maintain healthy weight, limit NSAIDs; follow DASH guidelines

## 2017-11-25 NOTE — Assessment & Plan Note (Signed)
Limit NSAIDs as much as possible; for patient, quality of life important so she will use least amount of NSAID to help with pain and functionality

## 2017-11-27 ENCOUNTER — Ambulatory Visit: Payer: Managed Care, Other (non HMO) | Admitting: Family Medicine

## 2017-11-28 ENCOUNTER — Encounter: Payer: Self-pay | Admitting: Family Medicine

## 2018-04-29 IMAGING — CR DG HIP (WITH OR WITHOUT PELVIS) 2-3V*R*
1 series · 3 of 3 positions shown · non-contrast
Comparison: None.

CLINICAL DATA: Right hip pain.  No reported injury.

EXAM:
DG HIP (WITH OR WITHOUT PELVIS) 2-3V RIGHT

[Series 1: view not recorded · 0.14mm/px · 3 of 3 slices shown]
[im 1/3]
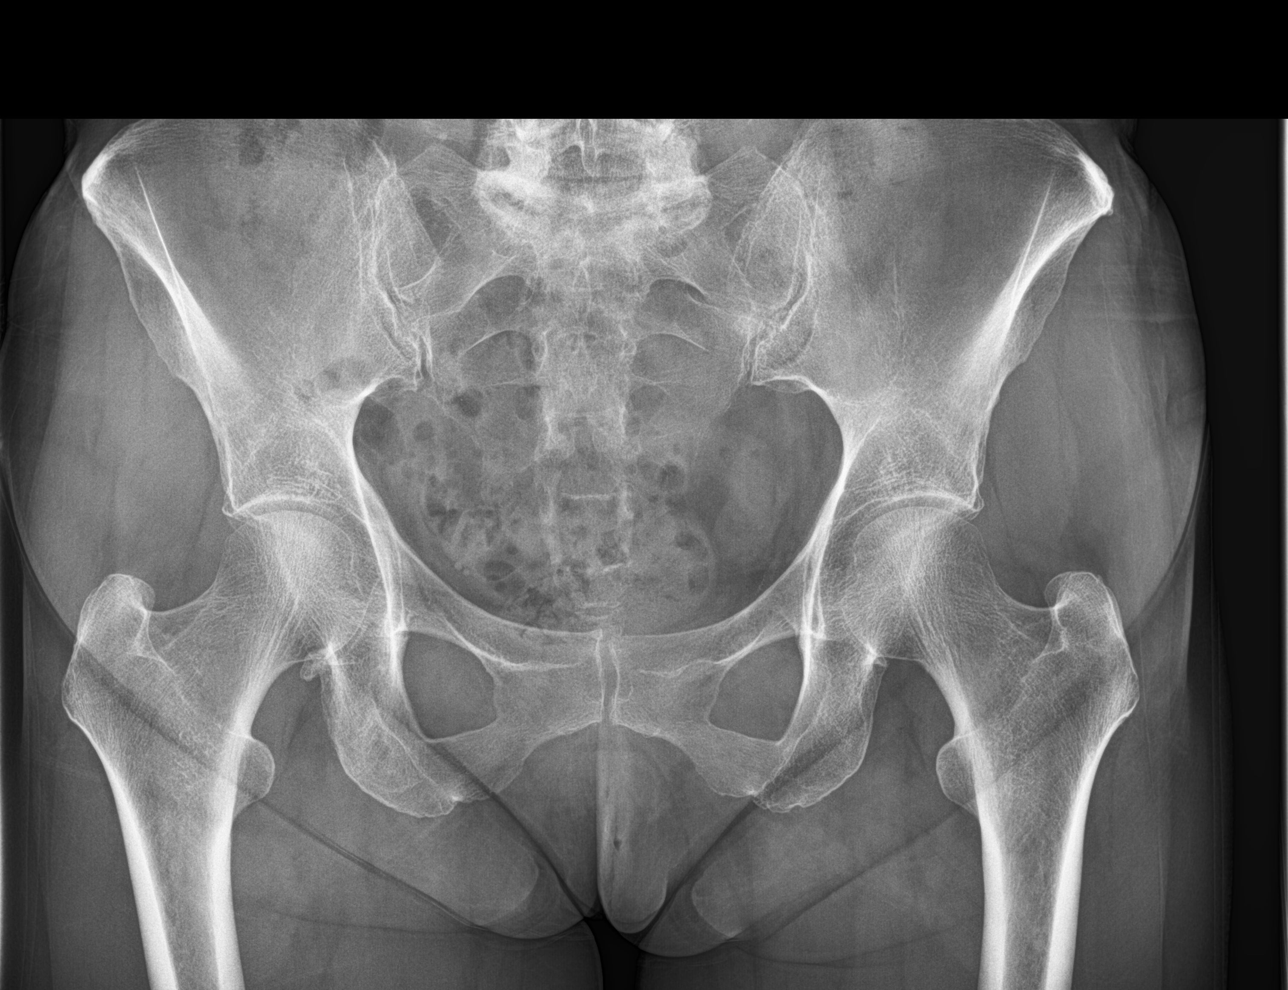
[im 2/3]
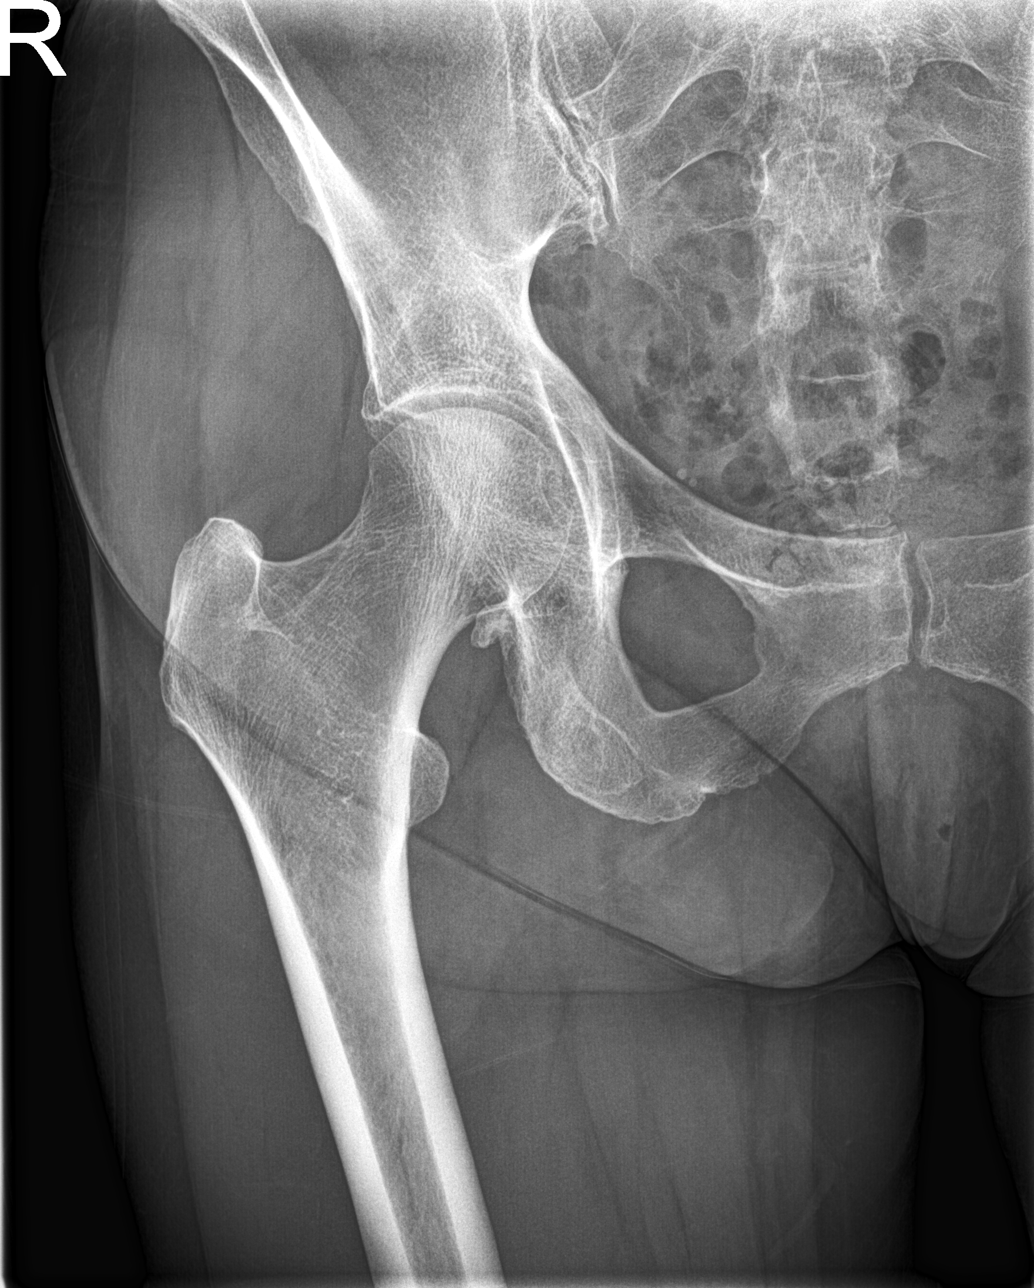
[im 3/3]
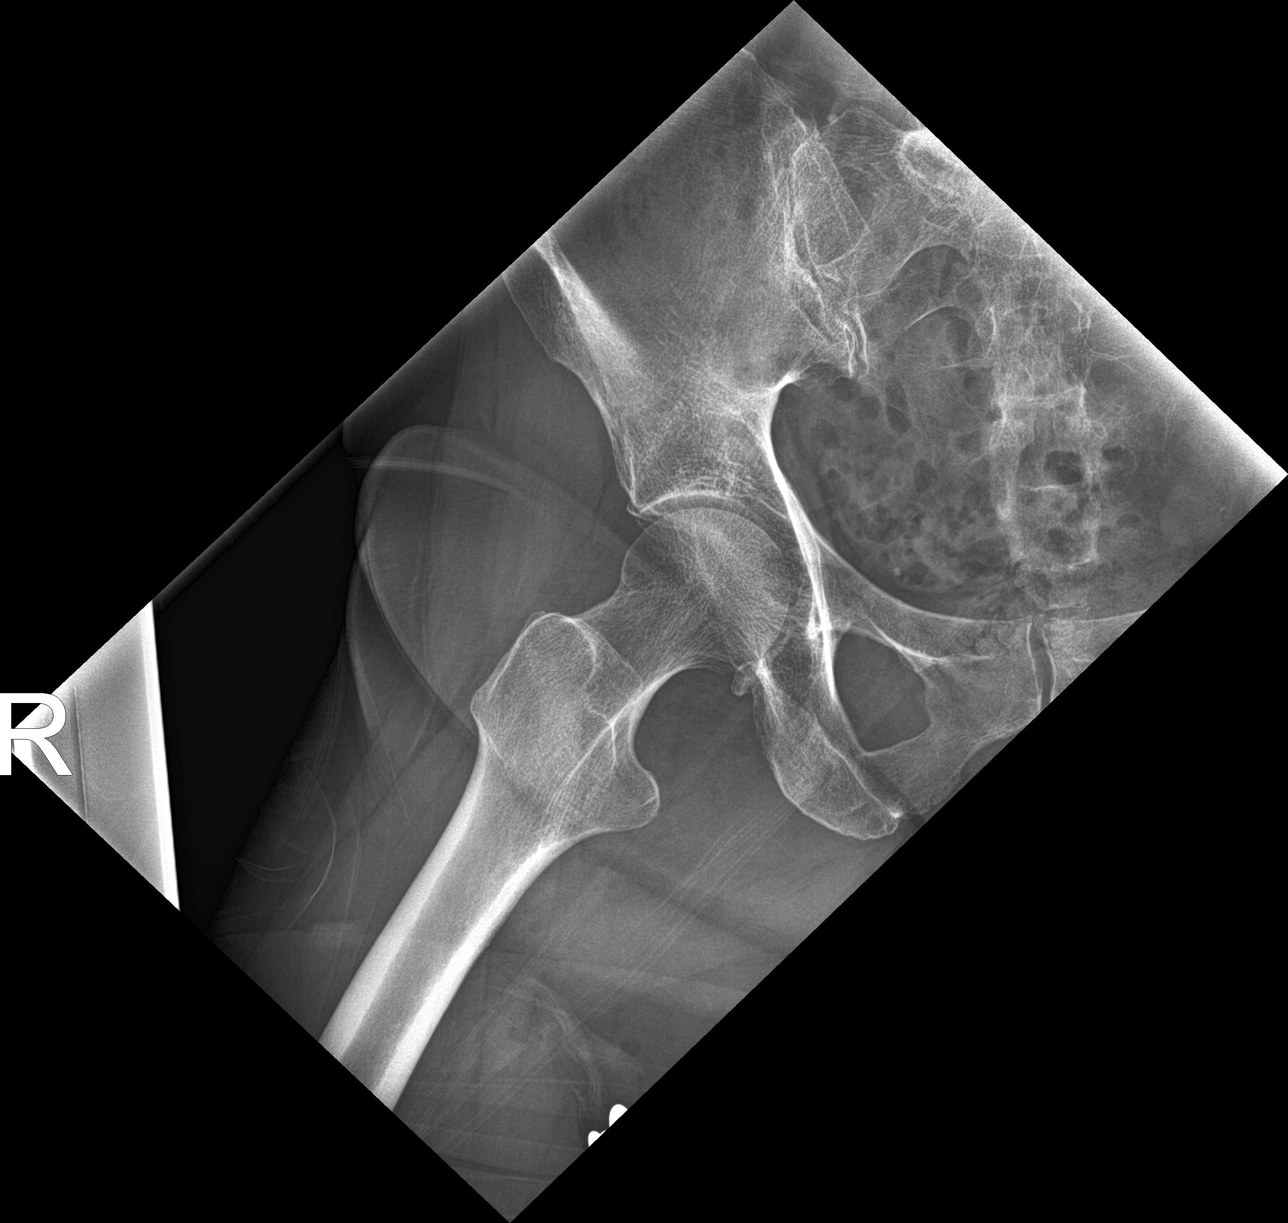

[3 of 3 positions shown; findings below may reference images not displayed]

FINDINGS: No fracture, dislocation or suspicious focal osseous lesion. Small
to moderate osteophyte at the right inferior acetabulum. Facet
arthropathy is seen in the visualized lower lumbar spine.
IMPRESSION: Mild osteoarthritis in the right hip joint.

## 2019-04-28 IMAGING — US US BREAST*L* LIMITED INC AXILLA
1 series · 6 of 6 positions shown · non-contrast
Comparison: Previous exam(s).

CLINICAL DATA: Palpable lump left breast 1 o'clock

EXAM:
2D DIGITAL DIAGNOSTIC BILATERAL MAMMOGRAM WITH CAD AND ADJUNCT TOMO
ULTRASOUND LEFT BREAST

[Series 1: us breast*left* limited inc axilla · 0.07mm/px · 6 of 6 slices shown]
[im 1/6]
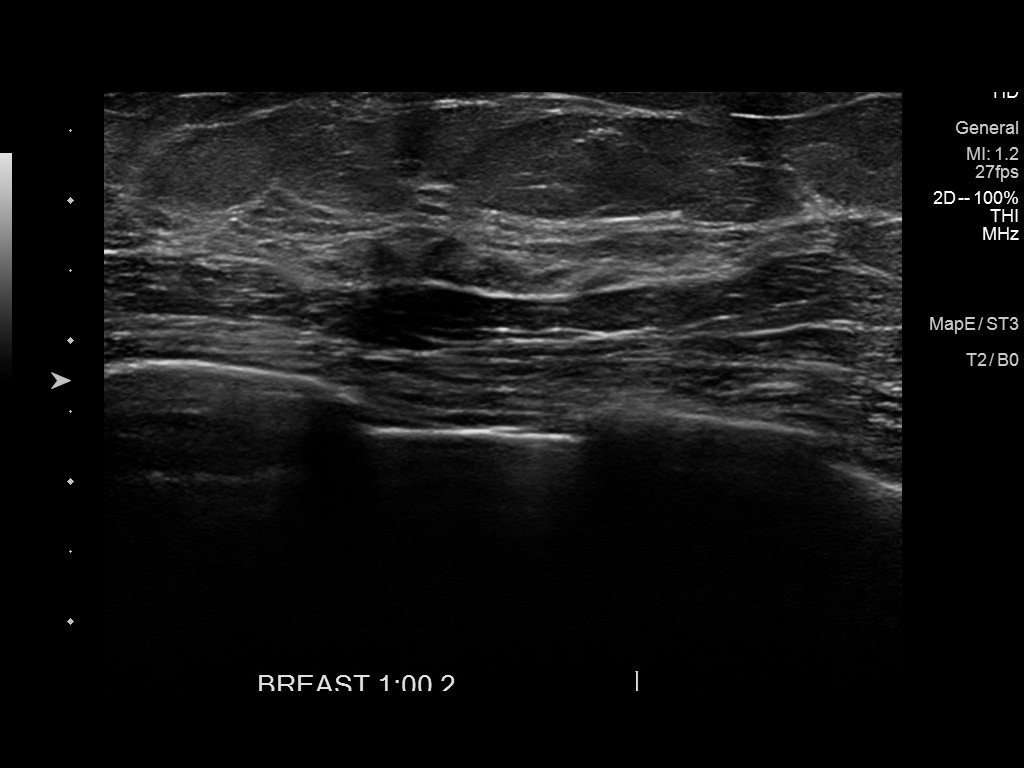
[im 2/6]
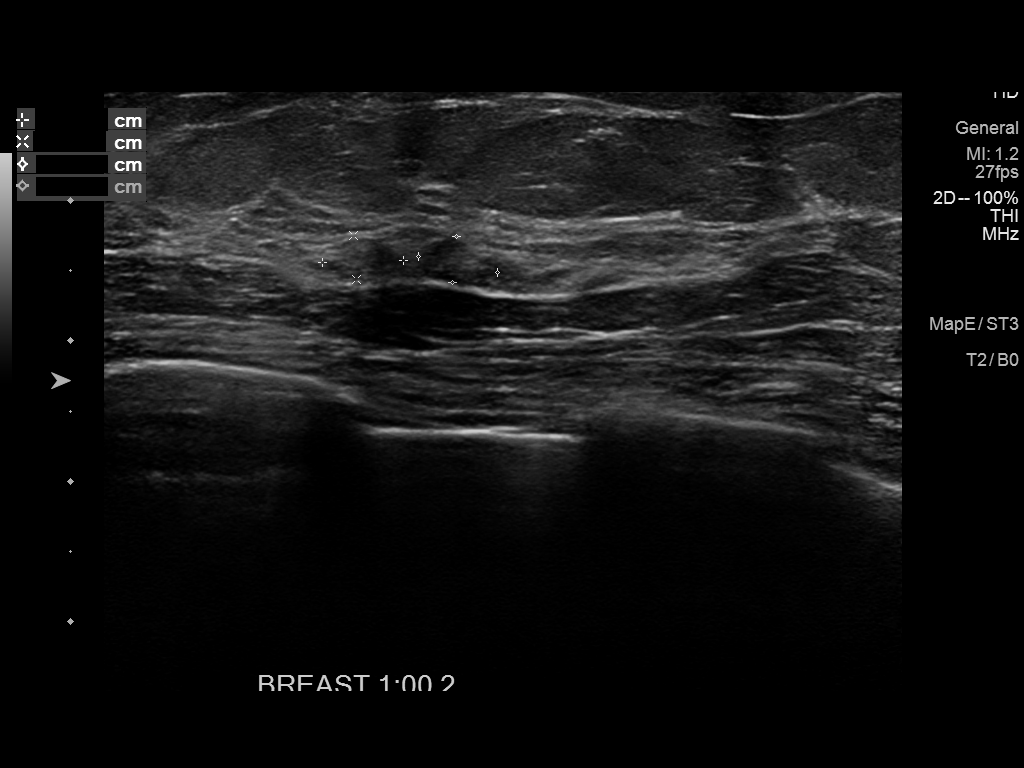
[im 3/6]
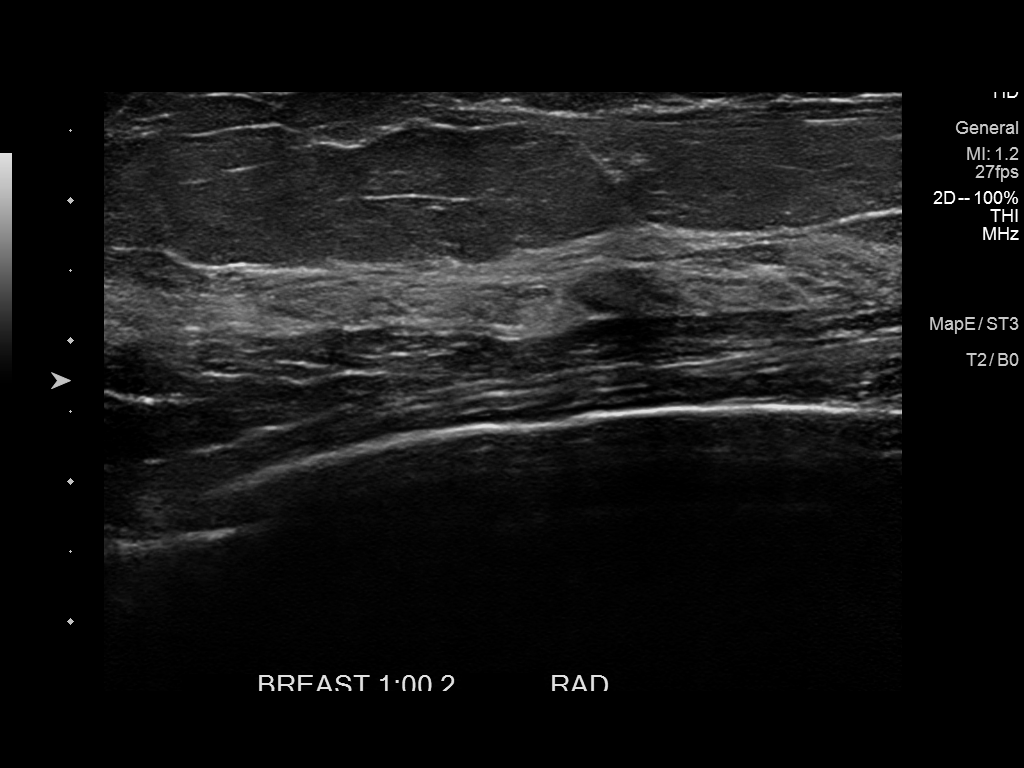
[im 4/6]
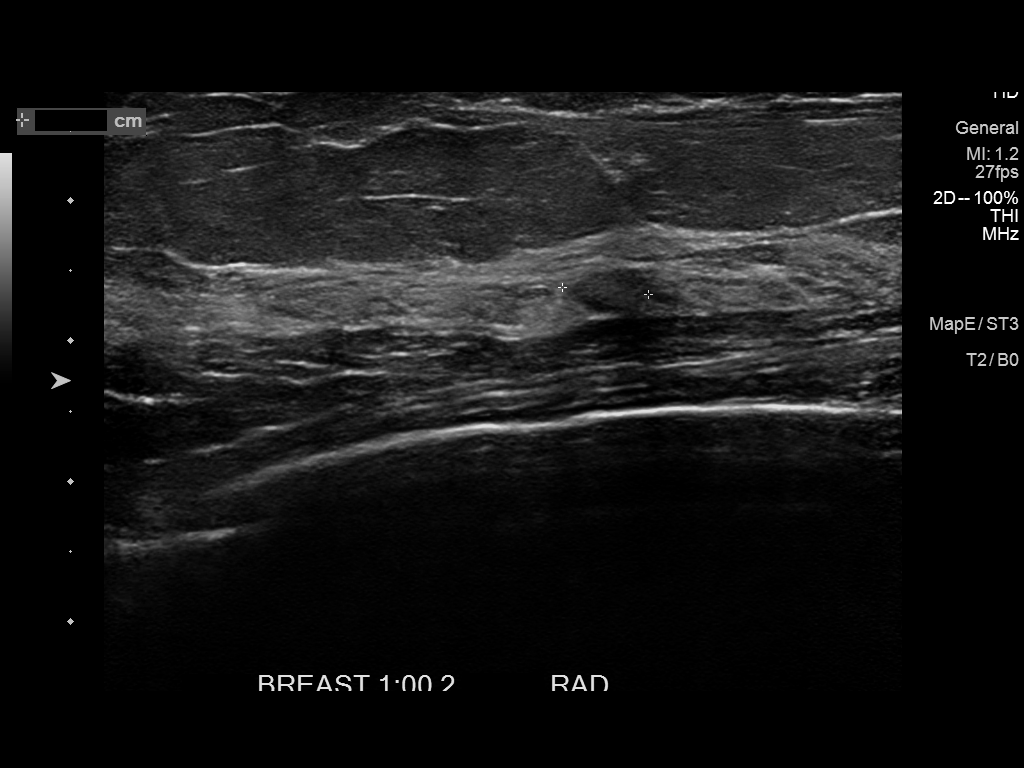
[im 5/6]
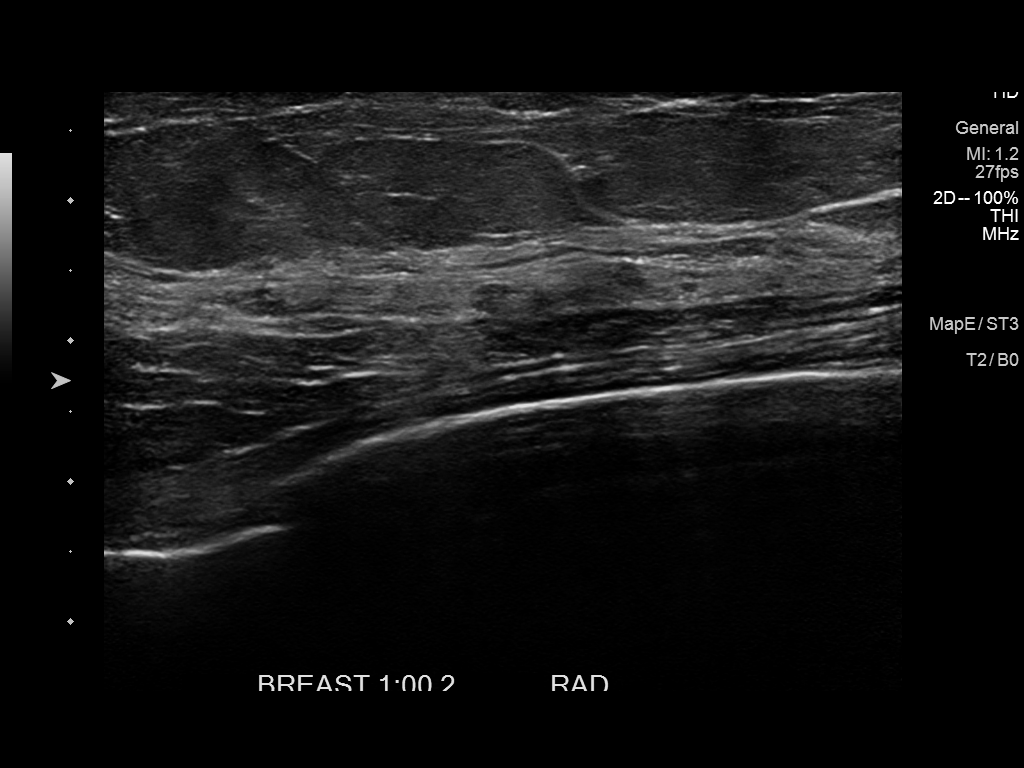
[im 6/6]
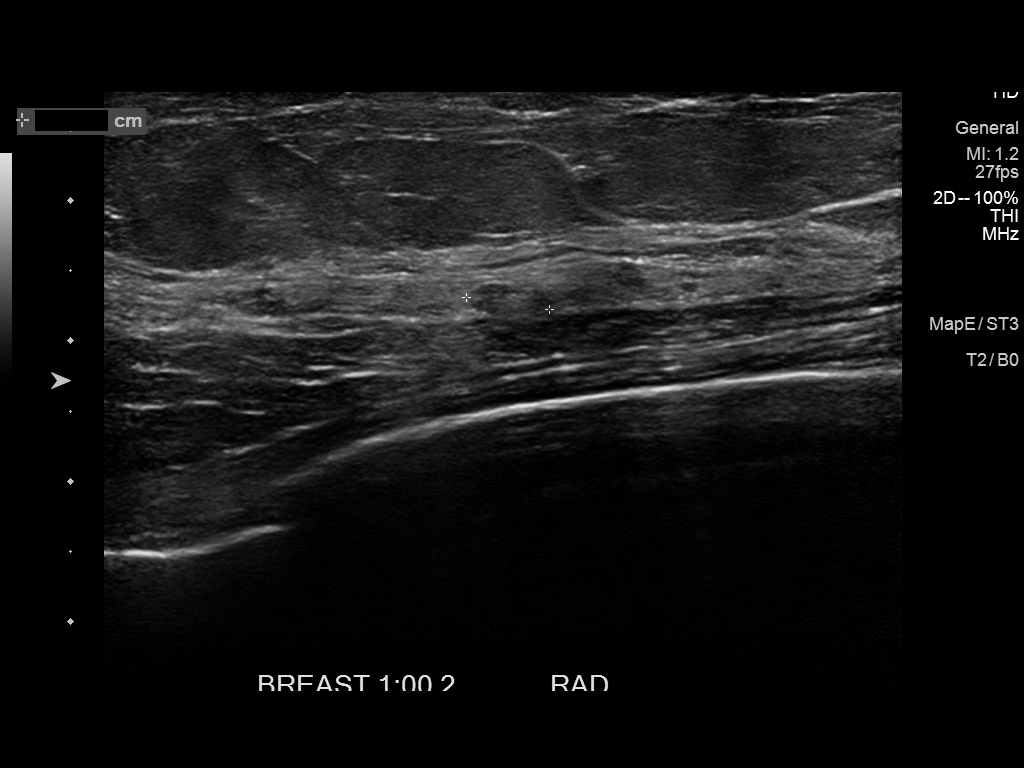

[6 of 6 positions shown; findings below may reference images not displayed]

ACR Breast Density Category b: There are scattered areas of
fibroglandular density.
FINDINGS: Cc and MLO views of bilateral breast, exaggerated left CC view are
submitted. No suspicious abnormalities identified bilaterally.

Mammographic images were processed with CAD.

Targeted ultrasound is performed, showing 2 adjacent normal
intramammary lymph nodes at the left breast 1 o'clock 2 cm from
nipple each measuring approximately 6 mm. No other focal abnormal
discrete cystic or solid lesion is identified in the left breast 1
o'clock.
IMPRESSION: Benign findings.

RECOMMENDATION:
Routine screening mammogram in 1 year.

I have discussed the findings and recommendations with the patient.
Results were also provided in writing at the conclusion of the
visit. If applicable, a reminder letter will be sent to the patient
regarding the next appointment.

BI-RADS CATEGORY  2: Benign.
